# Patient Record
Sex: Male | Born: 1945
Health system: Southern US, Community
[De-identification: ages and names within clinical notes are randomized; demographics above are authoritative.]

## PROBLEM LIST (undated history)

## (undated) DIAGNOSIS — E162 Hypoglycemia, unspecified: Secondary | ICD-10-CM

## (undated) DIAGNOSIS — K219 Gastro-esophageal reflux disease without esophagitis: Secondary | ICD-10-CM

## (undated) DIAGNOSIS — M1990 Unspecified osteoarthritis, unspecified site: Secondary | ICD-10-CM

## (undated) DIAGNOSIS — K759 Inflammatory liver disease, unspecified: Secondary | ICD-10-CM

## (undated) DIAGNOSIS — N529 Male erectile dysfunction, unspecified: Secondary | ICD-10-CM

## (undated) DIAGNOSIS — C61 Malignant neoplasm of prostate: Secondary | ICD-10-CM

## (undated) HISTORY — DX: Hypoglycemia, unspecified: E16.2

## (undated) HISTORY — DX: Male erectile dysfunction, unspecified: N52.9

## (undated) HISTORY — DX: Inflammatory liver disease, unspecified: K75.9

## (undated) HISTORY — DX: Gastro-esophageal reflux disease without esophagitis: K21.9

## (undated) HISTORY — DX: Unspecified osteoarthritis, unspecified site: M19.90

## (undated) HISTORY — PX: ELBOW SURGERY: SHX618

## (undated) HISTORY — PX: FOOT SURGERY: SHX648

## (undated) HISTORY — PX: PROSTATE SURGERY: SHX751

## (undated) HISTORY — DX: Malignant neoplasm of prostate: C61

---

## 2002-01-28 HISTORY — PX: INSERTION PROSTATE RADIATION SEED: SUR718

## 2002-08-24 ENCOUNTER — Ambulatory Visit: Admission: RE | Admit: 2002-08-24 | Discharge: 2002-11-22 | Payer: Self-pay | Admitting: Radiation Oncology

## 2002-09-29 ENCOUNTER — Encounter: Payer: Self-pay | Admitting: Urology

## 2002-09-29 ENCOUNTER — Encounter: Admission: RE | Admit: 2002-09-29 | Discharge: 2002-09-29 | Payer: Self-pay | Admitting: Urology

## 2002-10-27 ENCOUNTER — Ambulatory Visit (HOSPITAL_BASED_OUTPATIENT_CLINIC_OR_DEPARTMENT_OTHER): Admission: RE | Admit: 2002-10-27 | Discharge: 2002-10-27 | Payer: Self-pay | Admitting: Urology

## 2002-10-27 ENCOUNTER — Encounter: Payer: Self-pay | Admitting: Urology

## 2002-10-27 ENCOUNTER — Ambulatory Visit (HOSPITAL_COMMUNITY): Admission: RE | Admit: 2002-10-27 | Discharge: 2002-10-27 | Payer: Self-pay | Admitting: Urology

## 2002-11-28 ENCOUNTER — Ambulatory Visit: Admission: RE | Admit: 2002-11-28 | Discharge: 2002-12-01 | Payer: Self-pay | Admitting: Radiation Oncology

## 2002-12-30 ENCOUNTER — Ambulatory Visit: Admission: RE | Admit: 2002-12-30 | Discharge: 2002-12-30 | Payer: Self-pay | Admitting: Radiation Oncology

## 2005-03-20 ENCOUNTER — Encounter (HOSPITAL_COMMUNITY): Admission: RE | Admit: 2005-03-20 | Discharge: 2005-05-07 | Payer: Self-pay | Admitting: Urology

## 2005-07-11 ENCOUNTER — Ambulatory Visit (HOSPITAL_BASED_OUTPATIENT_CLINIC_OR_DEPARTMENT_OTHER): Admission: RE | Admit: 2005-07-11 | Discharge: 2005-07-11 | Payer: Self-pay | Admitting: Orthopedic Surgery

## 2005-07-11 ENCOUNTER — Encounter (INDEPENDENT_AMBULATORY_CARE_PROVIDER_SITE_OTHER): Payer: Self-pay | Admitting: Specialist

## 2007-06-19 IMAGING — NM NM TUMOR LOCAL/TRACER DISTR WHOLE BODY 2+ DAYS
1 series · 6 of 6 positions shown · non-contrast
Comparison: none

CLINICAL DATA: Prostate Cancer
NM PROSTATE TUMOR LOCALIZATION (SINGLE DAY PROSTASCINT IMAGING) WITH SPECT:
TECHNIQUE: 5.1 mCi of Undium-UUU labeled ProstaScint was administered intravenously. Routine bowel preparation was performed prior to imaging. On day 5, whole-body static imaging and SPECT imaging of the pelvis in the coronal, sagittal and axial planes was performed. The SPECT images were then fused with CT data using [employer] software.  
SPECT images were obtained and infused with CT data obtained at [REDACTED] dated 03/20/05 using the [employer] software package.

[Series 1: pr prostascint · 9.5mm · 9.49mm/px · 6 of 45 frames shown]
[frame 4/45]
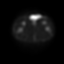
[frame 12/45]
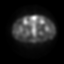
[frame 19/45]
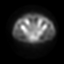
[frame 27/45]
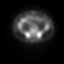
[frame 34/45]
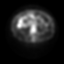
[frame 42/45]
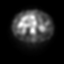

[6 of 6 positions shown; findings below may reference images not displayed]

FINDINGS: Brachytherapy seeds are identified within the prostate gland. There is mild radiotracer uptake within the central portion of the prostate gland.  No abnormal uptake is seen within the seminal vesicles. 
There is no evidence for lymph node metastasis.
IMPRESSION: 1.  No evidence for lymph node metastasis. 
2.  Mild radiotracer uptake within the prostate bed which may represent residual or recurrent local tumor.

## 2008-01-29 HISTORY — PX: HAND SURGERY: SHX662

## 2008-03-14 ENCOUNTER — Encounter: Admission: RE | Admit: 2008-03-14 | Discharge: 2008-03-14 | Payer: Self-pay | Admitting: Family Medicine

## 2008-12-09 ENCOUNTER — Encounter (INDEPENDENT_AMBULATORY_CARE_PROVIDER_SITE_OTHER): Payer: Self-pay | Admitting: Orthopedic Surgery

## 2008-12-09 ENCOUNTER — Ambulatory Visit (HOSPITAL_BASED_OUTPATIENT_CLINIC_OR_DEPARTMENT_OTHER): Admission: RE | Admit: 2008-12-09 | Discharge: 2008-12-09 | Payer: Self-pay | Admitting: Orthopedic Surgery

## 2009-01-04 ENCOUNTER — Ambulatory Visit (HOSPITAL_BASED_OUTPATIENT_CLINIC_OR_DEPARTMENT_OTHER): Admission: RE | Admit: 2009-01-04 | Discharge: 2009-01-04 | Payer: Self-pay | Admitting: Orthopedic Surgery

## 2009-01-28 HISTORY — PX: ACHILLES TENDON REPAIR: SUR1153

## 2010-05-01 LAB — ANAEROBIC CULTURE

## 2010-05-01 LAB — CULTURE, ROUTINE-ABSCESS

## 2010-06-15 NOTE — Op Note (Signed)
NAME:  David Mata, David Mata NO.:  [id number]   MEDICAL RECORD NO.:  [id number]                   PATIENT TYPE:  AMB   LOCATION:  [facility]                                 FACILITY:  [facility]   PHYSICIAN:  Valetta Fuller, M.D.               DATE OF BIRTH:  [birth year]   DATE OF PROCEDURE:  10/27/2002  DATE OF DISCHARGE:                                 OPERATIVE REPORT   PREOPERATIVE DIAGNOSIS:  Clinical stage T1C adenocarcinoma of the prostate.   POSTOPERATIVE DIAGNOSIS:  Clinical stage T1C adenocarcinoma of the prostate.   PROCEDURES PERFORMED:  1. Interstitial prostatic seed implantation with I-125 seeds.  2. Flexible cystoscopy.   SURGEON:  Valetta Fuller, M.D.   ASSISTANT:  Maryln Gottron, M.D.   ANESTHESIA:  General.   INDICATIONS:  David Mata is a 65 year old male.  Approximately a year ago  he was noted to have a mildly elevated PSA of 4.2 with a markedly reduced  PSA-2 reading of 10%.  We recommended ultrasound and biopsy at that time,  but the patient elected not to pursue that.  He subsequently was seen in  follow-up.  His PSA had increased to 5.  He then agreed to ultrasound and  biopsy.  Ultrasound revealed approximately a 30 g prostate.  The patient did  end up with bilateral Gleason's 3+4=7 adenocarcinoma.  The patient underwent  extensive counseling by myself as well as Dr. Dayton Scrape with regard to  treatment options.  We felt that radical retropubic prostatectomy might be  the best approach, but he was unwilling to accept some of the potential  complications and issues with regard to radical prostatectomy.  He elected  to proceed with a combination of external beam radiation therapy and  interstitial seed implantation.  The patient received 4500 cGy of external  beam radiation therapy, which was completed approximately two weeks ago.  He  presents now for interstitial seed implantation.   TECHNIQUE AND FINDINGS:  The patient was brought to  the operating room,  where he had successful induction of general endotracheal anesthesia.  He  was placed in the mid-lithotomy position and prepped and draped in the usual  manner.  Great care was taken in positioning the patient to simulate that of  the previous ultrasound.  A transrectal probe was utilized and fluoroscopy  was also brought in.  Once we felt that the positioning was excellent and  the prostatic positioning was the same as it was on the planning ultrasound,  we anchored the ultrasound stand.  Anchoring needles were placed into the  prostate and volume study was repeated along with contouring.  Seed  implantation was then performed.  This was done with real-time transrectal  ultrasound imaging as well as real-time fluoroscopy.  The rectal interface  had been contoured.  A total of 22 needles were placed with 70 seeds.  Again  each needle passage was done with real-time fluoroscopy as well as  transrectal ultrasonography.  At the completion of the procedure the real-  time dosimetry report looked excellent.  The Foley catheter which had been  placed initially with  contrast in the balloon was removed and flexible cystoscopy was performed.  We saw no evidence of seeds within the urethra or bladder.  A repeat Foley  catheter was inserted and left to gravity drainage.  The patient appeared to  tolerate the procedure well.  There were no obvious complications.                                               Valetta Fuller, M.D.    DSG/MEDQ  D:  10/27/2002  T:  10/27/2002  Job:  [id number]   cc:   David Mata. Manus Gunning, M.D.  [address] [place]  [place] [address]  Fax: [phone]   Maryln Gottron, M.D.  [address] [facility]  [place]  [place]  [address]  Fax: [phone]

## 2010-06-15 NOTE — Op Note (Signed)
NAME:  David Mata, HANTON NO.:  [id number]   MEDICAL RECORD NO.:  [id number]          PATIENT TYPE:  AMB   LOCATION:  [facility]                          FACILITY:  [facility]   PHYSICIAN:  Cindee Salt, M.D.       DATE OF BIRTH:  1945-12-25   DATE OF PROCEDURE:  DATE OF DISCHARGE:                                 OPERATIVE REPORT   PREOPERATIVE DIAGNOSIS:  Right carpal tunnel syndrome, olecranon bursa,  right elbow.   POSTOPERATIVE DIAGNOSIS:  Right carpal tunnel syndrome, olecranon bursa,  right elbow.   OPERATION:  Excision olecranon bursa with gouty tophus, right carpal tunnel  release.   SURGEON:  Cindee Salt, M.D.   ASSISTANT:  Carolyne Fiscal R.N.   ANESTHESIA:  General.   HISTORY:  The patient is a 65-year-old male with a history of a large  olecranon bursa with obvious gouty tophus inclusion.  This is problematic  for him due to its size.  He also has carpal tunnel syndrome, EMG nerve  conductions positive,is desirous of removal of the olecranon bursa and  release of carpal tunnel.  He is aware of risks and complications including  injury to the ulnar nerve, incomplete relief of symptoms, infection,  recurrence of the bursa in the preoperative area.  He is seen, identified  given antibiotics questions encouraged and answered.  The areas of surgery  marked by both the patient and surgeon.   PROCEDURE:  The patient was brought to the operating room where a general  anesthetic was carried out without difficulty.  He was prepped using  DuraPrep, supine position.  After 3 minutes of dry time he was draped.  The  limb was exsanguinated with an Esmarch bandage, tourniquet placed on the arm  was inflated to 250 mmHg.  The carpal tunnel release was performed first.  A  longitudinal incision was made in the palm, carried down through  subcutaneous tissue.  Bleeders were electrocauterized.  The palmar fascia  was split, the superficial palmar arch identified.  The flexor tendon to  the  ring and little finger identified.  To the ulnar side of median nerve.  The  carpal retinaculum was incised with sharp dissection, a right angle and  Sewall retractor were placed between skin and forearm fascia.  The fascia  was released for approximately a centimeter and a half proximal to the wrist  crease under direct vision.  The canal was explored.  No tophaceous material  was noted.  Area of compression to the nerve was apparent.  No further  lesions were identified.  The wound was irrigated and skin was closed with  interrupted 5-0 nylon sutures.  A separate longitudinal incision was made  posteriorly on the elbow, carried down through subcutaneous tissue.  A large  tophaceous gouty material was immediately encountered.  With blunt sharp  dissection, this was dissected free.  This measured approximately 10 cm in  diameter.  With blunt sharp dissection the entire olecranon bursa was  removed with care to protect the ulnar nerve and with no violation of  Osborne's fascia.  The entire  specimen was sent to pathology.  The area of  olecranon proximally was debrided with a rongeur, taking care to maintain  the insertion of the triceps.  The wound was then irrigated.  A doubled over  vessel loop placed.  The subcutaneous tissue closed with interrupted 4-0  Vicryl and skin with interrupted 5-0 nylon sutures.  Sterile  compressive dressing and long-arm splint applied.  The patient tolerated the  procedure well and was taken to the recovery room for observation in  satisfactory condition.  He will be discharged home to return to the [facility] [place] in one week on Vicodin.           ______________________________  Cindee Salt, M.D.     GK/MEDQ  D:  07/11/2005  T:  07/11/2005  Job:  [id number]

## 2010-11-07 ENCOUNTER — Other Ambulatory Visit: Payer: Self-pay | Admitting: Family Medicine

## 2010-11-07 DIAGNOSIS — R7989 Other specified abnormal findings of blood chemistry: Secondary | ICD-10-CM

## 2010-11-08 ENCOUNTER — Ambulatory Visit
Admission: RE | Admit: 2010-11-08 | Discharge: 2010-11-08 | Disposition: A | Discharge status: Home / Self Care | Payer: Medicare Other | Source: Ambulatory Visit | Attending: Family Medicine | Admitting: Family Medicine

## 2010-11-08 DIAGNOSIS — R7989 Other specified abnormal findings of blood chemistry: Secondary | ICD-10-CM

## 2010-11-09 ENCOUNTER — Other Ambulatory Visit: Payer: Self-pay | Admitting: Family Medicine

## 2010-11-09 DIAGNOSIS — R935 Abnormal findings on diagnostic imaging of other abdominal regions, including retroperitoneum: Secondary | ICD-10-CM

## 2010-11-12 ENCOUNTER — Ambulatory Visit
Admission: RE | Admit: 2010-11-12 | Discharge: 2010-11-12 | Disposition: A | Discharge status: Home / Self Care | Payer: Medicare Other | Source: Ambulatory Visit | Attending: Family Medicine | Admitting: Family Medicine

## 2010-11-12 DIAGNOSIS — R935 Abnormal findings on diagnostic imaging of other abdominal regions, including retroperitoneum: Secondary | ICD-10-CM

## 2010-11-12 MED ORDER — GADOBENATE DIMEGLUMINE 529 MG/ML IV SOLN
20.0000 mL | Freq: Once | INTRAVENOUS | Status: AC | PRN
  Administered 2010-11-12: 20 mL via INTRAVENOUS

## 2010-11-16 ENCOUNTER — Encounter (INDEPENDENT_AMBULATORY_CARE_PROVIDER_SITE_OTHER): Payer: Self-pay | Admitting: General Surgery

## 2010-11-22 ENCOUNTER — Other Ambulatory Visit (INDEPENDENT_AMBULATORY_CARE_PROVIDER_SITE_OTHER): Payer: Self-pay | Admitting: General Surgery

## 2010-11-22 ENCOUNTER — Encounter (INDEPENDENT_AMBULATORY_CARE_PROVIDER_SITE_OTHER): Payer: Self-pay | Admitting: General Surgery

## 2010-11-22 ENCOUNTER — Ambulatory Visit (INDEPENDENT_AMBULATORY_CARE_PROVIDER_SITE_OTHER): Payer: Medicare Other | Admitting: General Surgery

## 2010-11-22 VITALS — BP 122/70 | HR 64 | Temp 96.3°F | Resp 20 | Ht 73.0 in | Wt 244.5 lb

## 2010-11-22 DIAGNOSIS — K8020 Calculus of gallbladder without cholecystitis without obstruction: Secondary | ICD-10-CM

## 2010-11-22 NOTE — Patient Instructions (Signed)
Laparoscopic Cholecystectomy Laparoscopic cholecystectomy is surgery to remove the gallbladder. The gallbladder is located slightly to the right of center in the abdomen, behind the liver. It is a concentrating and storage sac for the bile produced in the liver. Bile aids in the digestion and absorption of fats. Gallbladder disease (cholecystitis) is an inflammation of your gallbladder. This condition is usually caused by a buildup of gallstones (cholelithiasis) in your gallbladder. Gallstones can block the flow of bile, resulting in inflammation and pain. In severe cases, emergency surgery may be required. When emergency surgery is not required, you will have time to prepare for the procedure. Laparoscopic surgery is an alternative to open surgery. Laparoscopic surgery usually has a shorter recovery time. Your common bile duct may also need to be examined and explored. Your caregiver will discuss this with you if he or she feels this should be done. If stones are found in the common bile duct, they may be removed. LET YOUR CAREGIVER KNOW ABOUT:  Allergies to food or medicine.   Medicines taken, including vitamins, herbs, eyedrops, over-the-counter medicines, and creams.   Use of steroids (by mouth or creams).   Previous problems with anesthetics or numbing medicines.   History of bleeding problems or blood clots.   Previous surgery.   Other health problems, including diabetes and kidney problems.   Possibility of pregnancy, if this applies.  RISKS AND COMPLICATIONS All surgery is associated with risks. Some problems that may occur following this procedure include:  Infection.   Damage to the common bile duct, nerves, arteries, veins, or other internal organs such as the stomach or intestines.   Bleeding.   A stone may remain in the common bile duct.  BEFORE THE PROCEDURE  Do not take aspirin for 3 days prior to surgery or blood thinners for 1 week prior to surgery.   Do not eat or  drink anything after midnight the night before surgery.   Let your caregiver know if you develop a cold or other infectious problem prior to surgery.   You should be present 60 minutes before the procedure or as directed.  PROCEDURE  You will be given medicine that makes you sleep (general anesthetic). When you are asleep, your surgeon will make several small cuts (incisions) in your abdomen. One of these incisions is used to insert a small, lighted scope (laparoscope) into the abdomen. The laparoscope helps the surgeon see into your abdomen. Carbon dioxide gas will be pumped into your abdomen. The gas allows more room for the surgeon to perform your surgery. Other operating instruments are inserted through the other incisions. Laparoscopic procedures may not be appropriate when:  There is major scarring from previous surgery.   The gallbladder is extremely inflamed.   There are bleeding disorders or unexpected cirrhosis of the liver.   A pregnancy is near term.   Other conditions make the laparoscopic procedure impossible.  If your surgeon feels it is not safe to continue with a laparoscopic procedure, he or she will perform an open abdominal procedure. In this case, the surgeon will make an incision to open the abdomen. This gives the surgeon a larger view and field to work within. This may allow the surgeon to perform procedures that sometimes cannot be performed with a laparoscope alone. Open surgery has a longer recovery time. AFTER THE PROCEDURE  You will be taken to the recovery area where a nurse will watch and check your progress.   You may be allowed to go home   the same day.   Do not resume physical activities until directed by your caregiver.   You may resume a normal diet and activities as directed.  Document Released: 01/14/2005 Document Revised: 09/26/2010 Document Reviewed: 06/29/2010 [facility] Patient Information 2012 [place], [place].  Laparoscopic Cholecystectomy Care  After These instructions give you information on caring for yourself after your procedure. Your doctor may also give you more specific instructions. Call your doctor if you have any problems or questions after your procedure. HOME CARE  Change your bandages (dressings) as told by your doctor.   Keep the wound dry and clean. Wash the wound gently with soap and water. Pat the wound dry with a clean towel.   Do not take baths, swim, or use hot tubs for 10 days, or as told by your doctor.   Only take medicine as told by your doctor.   Eat a normal diet as told by your doctor.   Do not lift anything heavier than 25 pounds (11.5 kg), or as told by your doctor.   Do not play contact sports for 1 week, or as told by your doctor.  GET HELP RIGHT AWAY IF:   Your wound is red, puffy (swollen), or painful.   You have yellowish-white fluid (pus) coming from the wound.   You have fluid draining from the wound for more than 1 day.   You have a bad smell coming from the wound.   Your wound breaks open.   You have a rash.   You have trouble breathing.   You have chest pain.   You have a bad reaction to your medicine.   You have a fever.   You have pain in the shoulders (shoulder strap areas).   You feel dizzy or pass out (faint).   You have severe belly (abdominal) pain.   You feel sick to your stomach (nauseous) or throw up (vomit) for more than 1 day.  MAKE SURE YOU:  Understand these instructions.   Will watch your condition.   Will get help right away if you are not doing well or get worse.  Document Released: 10/24/2007 Document Revised: 09/26/2010 Document Reviewed: 07/03/2010 [facility] Patient Information 2012 [place], [place].

## 2010-12-07 ENCOUNTER — Ambulatory Visit: Admit: 2010-12-07 | Payer: Self-pay | Admitting: General Surgery

## 2010-12-07 HISTORY — PX: CHOLECYSTECTOMY: SHX55

## 2010-12-07 SURGERY — LAPAROSCOPIC CHOLECYSTECTOMY WITH INTRAOPERATIVE CHOLANGIOGRAM
Anesthesia: General

## 2010-12-12 ENCOUNTER — Telehealth (INDEPENDENT_AMBULATORY_CARE_PROVIDER_SITE_OTHER): Payer: Self-pay | Admitting: General Surgery

## 2010-12-12 ENCOUNTER — Encounter (INDEPENDENT_AMBULATORY_CARE_PROVIDER_SITE_OTHER): Payer: Self-pay | Admitting: General Surgery

## 2010-12-12 NOTE — Telephone Encounter (Signed)
Per Dr Andrey Campanile, patient definitely needs CMET drawn prior to surgery. Spoke with Luanne who said she would get that done.

## 2010-12-12 NOTE — Progress Notes (Signed)
Chief Complaint  Patient presents with  . New Evaluation    eval of GB     HPI David Mata is a 65 y.o. male.   HPI  65 year old male referred by his primary care physician for evaluation for abdominal pain. The patient states that 5 weeks ago while riding in his car to his dentist appointment he developed severe upper abdominal pain. It is associated with nausea and diarrhea. He also had some dark urine that evening. He also endorsed some subjective fevers and chills. The pain resolved over the next couple hours. He denies any jaundice, weight loss, reflux, difficulty swallowing liquids or foods.  He states that his stool was slightly white in color for several days however it has normalized. He states that his appetite still hasn't returned to normal. He has had no additional attacks. He saw his primary care physician who ordered lab work and an abdominal ultrasound.  Past Medical History  Diagnosis Date  . Gout   . Prostate cancer   . Erectile dysfunction   . Hypoglycemia   . Gout   . Arthritis     Past Surgical History  Procedure Date  . Insertion prostate radiation seed 2004  . Achilles tendon repair 2011    right heel   . Elbow surgery 2008, 2010    Gout  both elbows  . Hand surgery 2010    gout     History reviewed. No pertinent family history.  Social History History  Substance Use Topics  . Smoking status: Former Smoker    Types: Cigars  . Smokeless tobacco: Never Used  . Alcohol Use: Yes    No Known Allergies  Current Outpatient Prescriptions  Medication Sig Dispense Refill  . Febuxostat (ULORIC) 80 MG TABS Take 80 mg by mouth daily.          Review of Systems Review of Systems  Constitutional: Positive for fever (subjective). Negative for activity change and unexpected weight change.  HENT: Negative for congestion, rhinorrhea and trouble swallowing.        Recent dental surgery - on amoxicillin  Eyes: Negative for photophobia and visual  disturbance.  Respiratory: Negative for chest tightness, shortness of breath and wheezing.   Cardiovascular: Negative for chest pain and leg swelling.       No SOB, No DOE, no orthopnea  Gastrointestinal: Negative for blood in stool, anal bleeding and rectal pain.       See hpi  Genitourinary: Negative for dysuria, difficulty urinating and penile pain.       +nocturia  Musculoskeletal:       Occasional gout problems  Neurological: Positive for headaches (ocassional). Negative for tremors, seizures, syncope and speech difficulty.  Hematological: Negative.   Psychiatric/Behavioral: Negative.     Blood pressure 122/70, pulse 64, temperature 96.3 F (35.7 C), temperature source Temporal, resp. rate 20, height 6\' 1"  (1.854 m), weight 244 lb 8 oz (110.904 kg).  Physical Exam Physical Exam  Vitals reviewed. Constitutional: He is oriented to person, place, and time. He appears well-developed and well-nourished.  HENT:  Head: Normocephalic and atraumatic.       No obvious dental abscess  Eyes: Conjunctivae are normal. No scleral icterus.  Neck: Normal range of motion. Neck supple. No tracheal deviation present.  Cardiovascular: Normal rate, regular rhythm and normal heart sounds.   Pulmonary/Chest: Effort normal and breath sounds normal.  Abdominal: Soft. Bowel sounds are normal. He exhibits no distension. There is no tenderness. There is no  rebound.  Musculoskeletal: Normal range of motion. He exhibits no edema.  Lymphadenopathy:    He has no cervical adenopathy.  Neurological: He is alert and oriented to person, place, and time. He exhibits normal muscle tone.  Skin: Skin is warm and dry.  Psychiatric: He has a normal mood and affect. His behavior is normal. Judgment and thought content normal.    Data Reviewed COMPLETE ABDOMINAL ULTRASOUND  Comparison: None.  Findings:  Gallbladder: There is a single echogenic focus within the  gallbladder which appears to move measuring 13 mm  most consistent  with gallstones. No pain is present over gallbladder with  compression.  Common bile duct: The common bile duct is normal measuring 4.5 mm  in diameter.  Liver: The liver is diffusely echogenic consistent with fatty  infiltration. No focal abnormality is seen.  IVC: Appears normal.  Pancreas: The head and tail of the pancreas are partially obscured  by bowel gas.  Spleen: The spleen is enlarged measuring 17.4 cm sagittally with a  volume of 1156 ml.  Right Kidney: No hydronephrosis is seen. The right kidney  measures 11.3 cm sagittally. However there is a focal bulge within  the mid lower pole which appears somewhat hypervascular and a renal  carcinoma cannot be excluded. The echogenicity however of this  focal lesion is similar to adjacent renal parenchyma and this could  represent normal lobulation of the right kidney. MRI of the abdomen  is recommended to evaluate further.  Left Kidney: No hydronephrosis is seen. The left kidney measures  12.7 cm. There is a similar slightly hypoechogenic bulging lesion  in the mid lower left kidney, and again a renal cell carcinoma  cannot be excluded. This lesion measures 2.9 x 2.8 x 2.7 cm.  Further assessment with MRI is recommended.  Abdominal aorta: The abdominal aorta is normal in caliber.  IMPRESSION:  1. Two bulging lesions in the mid lower kidneys with the left more  suspicious than the right for possible renal cell carcinoma.  Recommend MRI to assess further.  2. Single 13 mm gallstone. No pain over the gallbladder.  3. Fatty infiltration of the liver.  4. Portions of the pancreas are obscured by bowel gas.  5. Splenomegaly.  MRI ABDOMEN WITH AND WITHOUT CONTRAST  Technique: Multiplanar multisequence MR imaging of the abdomen was  performed both before and after administration of intravenous  contrast.  Contrast: 20mL MULTIHANCE GADOBENATE DIMEGLUMINE 529 MG/ML IV SOLN  Comparison: Ultrasound dated 11/08/2010    Findings: Kidneys are within normal limits. No renal cysts. No  enhancing renal lesions. Sonographic findings may have reflected  cortical lobulation.  No definite hepatic steatosis. No suspicious/enhancing hepatic  lesions.  Splenomegaly, measuring 16.9 cm in maximal dimension.  Pancreas and adrenal glands are within normal limits.  Cholelithiasis, without associated inflammatory changes. No  intrahepatic or extrahepatic ductal dilatation.  The visualized bowel is unremarkable.  No abdominal ascites.  No suspicious abdominal lymphadenopathy.  No focal osseous lesions.  IMPRESSION:  No suspicious/enhancing renal lesions. Sonographic findings may  have reflected cortical lobulation.  Cholelithiasis, without associated inflammatory changes.  Splenomegaly.  CMET from 11/07/10 t bili 1.3 ast 62 alt63 AP 70 nml cbc, nml tsh   Assessment    Symptomatic cholelihiasis    Plan    We discussed gallbladder disease. The patient was given Agricultural engineer. We discussed non-operative and operative management.   I discussed laparoscopic cholecystectomy with cholangiogram in detail.  The patient was given educational material as well  as diagrams detailing the procedure.  We discussed the risks and benefits of a laparoscopic cholecystectomy including, but not limited to bleeding, infection, injury to surrounding structures such as the intestine or liver, bile leak, retained gallstones, need to convert to an open procedure, prolonged diarrhea, blood clots such as  DVT, common bile duct injury, anesthesia risks, and possible need for additional procedures.  We discussed the typical post-operative recovery course. I explained that the likelihood of improvement of their symptoms is good.  The pt has elected to proceed with surgery. We will repeat his labs prior to surgery.  Mary Sella. Andrey Campanile, MD, FACS General, Bariatric, & Minimally Invasive Surgery [facility], [place]          [facility] M 12/12/2010, 8:50 AM

## 2010-12-13 ENCOUNTER — Other Ambulatory Visit (INDEPENDENT_AMBULATORY_CARE_PROVIDER_SITE_OTHER): Payer: Self-pay | Admitting: General Surgery

## 2010-12-13 DIAGNOSIS — K8010 Calculus of gallbladder with chronic cholecystitis without obstruction: Secondary | ICD-10-CM

## 2010-12-17 ENCOUNTER — Telehealth (INDEPENDENT_AMBULATORY_CARE_PROVIDER_SITE_OTHER): Payer: Self-pay

## 2010-12-17 NOTE — Telephone Encounter (Signed)
Patient called needing a 2 week follow up. Dr Andrey Campanile schedule was full except Dec 6th and only had new patient spots on it. Please call patient with an appointment time.

## 2010-12-18 NOTE — Telephone Encounter (Signed)
Appt made. Patient made aware to call if has any problems prior. Patient states he had a rough time the night of surgery at home but is better. Had some constipation, but is better today.

## 2011-01-03 ENCOUNTER — Encounter (INDEPENDENT_AMBULATORY_CARE_PROVIDER_SITE_OTHER): Payer: Self-pay | Admitting: General Surgery

## 2011-01-03 ENCOUNTER — Ambulatory Visit (INDEPENDENT_AMBULATORY_CARE_PROVIDER_SITE_OTHER): Payer: Medicare Other | Admitting: General Surgery

## 2011-01-03 VITALS — BP 126/82 | HR 60 | Temp 97.2°F | Resp 18 | Ht 72.0 in | Wt 242.4 lb

## 2011-01-03 DIAGNOSIS — Z09 Encounter for follow-up examination after completed treatment for conditions other than malignant neoplasm: Secondary | ICD-10-CM

## 2011-01-03 NOTE — Progress Notes (Signed)
Chief complaint: Postop  Procedure: Status post laparoscopic cholecystectomy with interoperative cholangiogram November 9  History of Present Ilness: 65 year old Caucasian male comes in for followup. He states that he is now doing well. He had about 4 days of constipation. He is now having daily bowel movements. He denies any diarrhea. He denies any fevers, chills, nausea, vomiting. He reports a good appetite. He did have a lot of gas pain in the evening after surgery, but that has resolved. He has no questions or concerns today  Physical Exam: BP 126/82  Pulse 60  Temp(Src) 97.2 F (36.2 C) (Temporal)  Resp 18  Ht 6' (1.829 m)  Wt 242 lb 6 oz (109.941 kg)  BMI 32.87 kg/m2  Gen: alert, NAD, non-toxic appearing Pupils: equal, no scleral icterus Pulm: Lungs clear to auscultation, symmetric chest rise CV: regular rate and rhythm Abd: soft, nontender, nondistended. Well-healed trocar sites. No cellulitis. No incisional hernia Ext: no edema, no calf tenderness Skin: no rash, no jaundice  Pathology: Chronic cholecystitis and cholelithiasis  Assessment and Plan: We discussed his pathology report. He appears to be doing well. We discussed the findings during surgery.  Since he is doing so well I am going to see him on an as needed basis. He was released to full activities  Mary Sella. Andrey Campanile, MD, FACS General, Bariatric, & Minimally Invasive Surgery [facility], [place]

## 2011-01-03 NOTE — Patient Instructions (Signed)
Your pathology showed chronic cholecystitis and gallstones (routine common finding)

## 2011-01-09 ENCOUNTER — Encounter (INDEPENDENT_AMBULATORY_CARE_PROVIDER_SITE_OTHER): Payer: Self-pay | Admitting: General Surgery

## 2011-01-09 ENCOUNTER — Encounter (INDEPENDENT_AMBULATORY_CARE_PROVIDER_SITE_OTHER): Payer: Self-pay

## 2011-11-07 ENCOUNTER — Other Ambulatory Visit: Payer: Self-pay | Admitting: Orthopaedic Surgery

## 2011-11-07 DIAGNOSIS — M542 Cervicalgia: Secondary | ICD-10-CM

## 2011-11-12 ENCOUNTER — Ambulatory Visit
Admission: RE | Admit: 2011-11-12 | Discharge: 2011-11-12 | Disposition: A | Discharge status: Home / Self Care | Payer: Medicare Other | Source: Ambulatory Visit | Attending: Orthopaedic Surgery | Admitting: Orthopaedic Surgery

## 2011-11-12 DIAGNOSIS — M542 Cervicalgia: Secondary | ICD-10-CM

## 2013-07-19 ENCOUNTER — Encounter: Payer: Self-pay | Admitting: Podiatry

## 2013-07-19 ENCOUNTER — Ambulatory Visit (INDEPENDENT_AMBULATORY_CARE_PROVIDER_SITE_OTHER): Payer: Medicare Other | Admitting: Podiatry

## 2013-07-19 VITALS — BP 141/76 | HR 67 | Resp 12

## 2013-07-19 DIAGNOSIS — L03039 Cellulitis of unspecified toe: Secondary | ICD-10-CM

## 2013-07-19 MED ORDER — AMOXICILLIN-POT CLAVULANATE 875-125 MG PO TABS: 1.0000 | ORAL_TABLET | Freq: Two times a day (BID) | ORAL | Status: DC

## 2013-07-19 NOTE — Patient Instructions (Signed)

## 2013-07-19 NOTE — Progress Notes (Signed)
   Subjective:    Patient ID: David Mata, male    DOB: 1945-09-16, 68 y.o.   MRN: [id number]  HPI  PT STATED LT FOOT GREAT TOE IS SWOLLEN, REDNESS, AND PAINFUL FOR 2 WEEKS. THE TOE IS BEEN THE SAME. THE TOE GET AGGRAVATED BY WEARING SHOES AND PRESSURE. TREATMENT TRIED SOAKING WITH EPSON SALT AND NEOSPORIN AND NO HELP.  Review of Systems  All other systems reviewed and are negative.      Objective:   Physical Exam Orientated x3 white male  Vascular: DP and PT pulses 2/4 bilaterally  Neurological: Ankle reflexes equal and reactive bilaterally  Dermatological: The right hallux distally demonstrates erythema, edema and warmth extending to the proximal interphalangeal joint. The lateral margin right hallux toenail is very tender to pressure with slight serous drainage noted.  Musculoskeletal: No restriction ankle, subtalar, midtarsal joints bilaterally       Assessment & Plan:   Assessment: Paronychia right hallux  Plan: The right hallux is blocked with 4 cc of 50-50 mixture of 2% plain Xylocaine and 0.5% plain Marcaine. The right hallux is prepped with Betadine and exsanguinated. The lateral margin of the right hallux toenail was excised. A small amount of serous-like drainage was noted. An antibiotic compression dressing was applied. The tourniquet was released and spontaneous capillary filling time noted in the right hallux  Postoperative oral and written instructions provided Augmentin 875/125  by mouth twice a day x10 days  Reappoint if symptoms persist or worsen after completion of antibiotics

## 2013-07-20 ENCOUNTER — Encounter: Payer: Self-pay | Admitting: Podiatry

## 2013-09-13 ENCOUNTER — Encounter: Payer: Self-pay | Admitting: Internal Medicine

## 2013-09-13 ENCOUNTER — Ambulatory Visit (INDEPENDENT_AMBULATORY_CARE_PROVIDER_SITE_OTHER): Payer: Medicare Other | Admitting: Internal Medicine

## 2013-09-13 VITALS — BP 142/76 | HR 68 | Resp 18 | Wt 249.0 lb

## 2013-09-13 DIAGNOSIS — R079 Chest pain, unspecified: Secondary | ICD-10-CM

## 2013-09-13 DIAGNOSIS — R0789 Other chest pain: Secondary | ICD-10-CM

## 2013-09-13 NOTE — Progress Notes (Signed)
HPI Patient is a 68 yo who is referred for evaluation of CP  No history of CAD Occasional chest heaviness.  NOt associated with activty  Yesterday woke up with some  Eased on own.   Works in [employer]   More tired than usual    Note labs in 2014 LDL was 86 No Known Allergies  Current Outpatient Prescriptions  Medication Sig Dispense Refill  . Febuxostat (ULORIC) 80 MG TABS Take 80 mg by mouth daily.         No current facility-administered medications for this visit.    Past Medical History  Diagnosis Date  . Gout   . Prostate cancer   . Erectile dysfunction   . Hypoglycemia   . Gout   . Arthritis     Past Surgical History  Procedure Laterality Date  . Insertion prostate radiation seed  2004  . Achilles tendon repair  2011    right heel   . Elbow surgery  2008, 2010    Gout  both elbows  . Hand surgery  2010    gout   . Cholecystectomy  12/07/10    with IOC  . Prostate surgery      Family History  Problem Relation Age of Onset  . Heart failure Mother   . Heart failure Father   . Heart attack Father   . Cancer Paternal Grandfather   . Heart attack Paternal Grandfather   . Diabetes Maternal Grandfather     History   Social History  . Marital Status: Single    Spouse Name: N/A    Number of Children: N/A  . Years of Education: N/A   Occupational History  . Not on file.   Social History Main Topics  . Smoking status: Former Smoker    Types: Cigars  . Smokeless tobacco: Never Used  . Alcohol Use: Yes  . Drug Use: No  . Sexual Activity:    Other Topics Concern  . Not on file   Social History Narrative  . No narrative on file    Review of Systems:  All systems reviewed.  They are negative to the above problem except as previously stated.  Vital Signs: BP 142/76  Pulse 68  Resp 18  Wt 249 lb (112.946 kg)  Physical Exam Patient is in NAD HEENT:  Normocephalic, atraumatic. EOMI, PERRLA.  Neck: JVP is normal.  No bruits.   Lungs: clear to auscultation. No rales no wheezes.  Heart: Regular rate and rhythm. Normal S1, S2. No S3.   No significant murmurs. PMI not displaced.  Abdomen:  Supple, nontender. Normal bowel sounds. No masses. No hepatomegaly.  Extremities:   Good distal pulses throughout. No lower extremity edema.  Musculoskeletal :moving all extremities.  Neuro:   alert and oriented x3.  CN II-XII grossly intact.  EKG  (08/16/13)  SR 74 bpm   Assessment and Plan:  1.  CP  Pain is somewhat atypical  Patient says he is not having any problems with actvities  Does not think he has any heart problems    I would not plan any testing for now.  Instructed the patient to watch for change in ability to do things, in SOB  In other new symptoms   Will be available as needed.

## 2013-09-13 NOTE — Patient Instructions (Signed)
Your physician recommends that you continue on your current medications as directed. Please refer to the Current Medication list given to you today.  Your physician recommends that you schedule a follow-up appointment AS NEEDED WITH DR.ROSS.   

## 2014-02-16 ENCOUNTER — Ambulatory Visit (INDEPENDENT_AMBULATORY_CARE_PROVIDER_SITE_OTHER): Payer: Medicare Other | Admitting: Podiatry

## 2014-02-16 ENCOUNTER — Encounter: Payer: Self-pay | Admitting: Podiatry

## 2014-02-16 VITALS — BP 144/77 | HR 70 | Resp 13 | Ht 72.0 in | Wt 240.0 lb

## 2014-02-16 DIAGNOSIS — L600 Ingrowing nail: Secondary | ICD-10-CM

## 2014-02-16 NOTE — Patient Instructions (Signed)
If pain control as needed use over-the-counter Tylenol ANTIBACTERIAL SOAP INSTRUCTIONS  THE DAY AFTER PROCEDURE  Please follow the instructions your doctor has marked.   Shower as usual. Before getting out, place a drop of antibacterial liquid soap (Dial) on a wet, clean washcloth.  Gently wipe washcloth over affected area.  Afterward, rinse the area with warm water.  Blot the area dry with a soft cloth and cover with antibiotic ointment (neosporin, polysporin, bacitracin) and band aid or gauze and tape  Place 3-4 drops of antibacterial liquid soap in a quart of warm tap water.  Submerge foot into water for 20 minutes.  If bandage was applied after your procedure, leave on to allow for easy lift off, then remove and continue with soak for the remaining time.  Next, blot area dry with a soft cloth and cover with a bandage.  Apply other medications as directed by your doctor, such as cortisporin otic solution (eardrops) or neosporin antibiotic ointment

## 2014-02-16 NOTE — Progress Notes (Signed)
   Subjective:    Patient ID: David Mata, male    DOB: 12/05/1944, 69 y.o.   MRN: [id number]  HPI Comments: N ingrown toenail L left lateral 1st toenail border D 1 month O worsening C swelling, red, peeling and painful A enclosed shoe and pressure T H202 cleansings  This patient presents again complaining of a painful ingrowing toenail on the left hallux. He presented on on 07/19/2013 with a similar problem. This chart note chief complaint is the left hallux, however, the note was treated as the right hallux.   Review of Systems  Musculoskeletal: Positive for myalgias.       Right hamstring problem  All other systems reviewed and are negative.      Objective:   Physical Exam Orientated 3  Vascular: DP pulses 2/4 bilaterally PT pulses 2/4 bilaterally Capillary reflex immediate bilaterally  Neurological: Deferred  Dermatological: The lateral margin of the left hallux toenail is incurvated with low-grade erythema, edema and tenderness. No drainage noted.  Musculoskeletal: Stable gait No restriction ankle, subtalar, midtarsal joints bilaterally      Assessment & Plan:   Assessment: Ingrowing lateral margin of the left hallux toenail with associated paronychia  Plan: Offered patient permanent removal of the lateral margin of the left hallux toenail and he verbally consents. The left hallux was blocked with 4 mL of 50-50 mixture 2% plain Xylocaine and 0.5% plain Marcaine. The left hallux was painted with Betadine and exsanguinated. The lateral margin of left hallux toenail was excised and a phenol matricectomy performed. There was no drainage noted. An antibiotic compression dressing was applied. The tourniquet was released and spontaneous capillary fill time noted in left hallux. Patient tolerated procedure without any difficulty.  Postoperative oral and written instructions provided. Recommended over-the-counter acetaminophen for pain control if needed.  Patient  will contact us if he has the future concerns

## 2014-02-17 ENCOUNTER — Encounter: Payer: Self-pay | Admitting: Podiatry

## 2014-03-28 ENCOUNTER — Ambulatory Visit: Payer: Medicare Other | Admitting: Podiatry

## 2014-04-04 ENCOUNTER — Encounter: Payer: Self-pay | Admitting: Podiatry

## 2014-04-04 ENCOUNTER — Ambulatory Visit (INDEPENDENT_AMBULATORY_CARE_PROVIDER_SITE_OTHER): Payer: Medicare Other | Admitting: Podiatry

## 2014-04-04 VITALS — BP 135/76 | HR 65 | Temp 97.3°F

## 2014-04-04 DIAGNOSIS — L600 Ingrowing nail: Secondary | ICD-10-CM

## 2014-04-04 NOTE — Patient Instructions (Signed)
Use over-the-counter acetaminophen, ibuprofen or Aleve as needed for pain control ANTIBACTERIAL SOAP INSTRUCTIONS  THE DAY AFTER PROCEDURE  Please follow the instructions your doctor has marked.   Shower as usual. Before getting out, place a drop of antibacterial liquid soap (Dial) on a wet, clean washcloth.  Gently wipe washcloth over affected area.  Afterward, rinse the area with warm water.  Blot the area dry with a soft cloth and cover with antibiotic ointment (neosporin, polysporin, bacitracin) and band aid or gauze and tape  Place 3-4 drops of antibacterial liquid soap in a quart of warm tap water.  Submerge foot into water for 20 minutes.  If bandage was applied after your procedure, leave on to allow for easy lift off, then remove and continue with soak for the remaining time.  Next, blot area dry with a soft cloth and cover with a bandage.  Apply other medications as directed by your doctor, such as cortisporin otic solution (eardrops) or neosporin antibiotic ointment

## 2014-04-05 NOTE — Progress Notes (Signed)
Patient ID: ANUSH WIEDEMAN, male   DOB: 10-Aug-1945, 69 y.o.   MRN: [id number]  Subjective: Patient presents today complaining of painful medial margin of the left hallux toenail with a history of some drainage and tenderness in the past 2 weeks. It is difficulty tolerating her close shoe. The lateral margin of the left hallux toenail was excised for permanent correction on the visit of 02/16/2014 and this site is comfortable  Objective: No change in patient's health history from visit of 02/16/2014  Vascular: DP and PT pulses 2/4 bilaterally Capillary reflex immediate bilaterally  Neurological: Deferred  Dermatological: The medial margin of the left hallux toenail is incurvated without any drainage and exquisite palpable tenderness which duplicates area of discomfort. The lateral margin of the left hallux toenail was narrowed from previous surgery, appears well-healed and is not tender to palpation.  Assessment: Ingrowing medial margin of the left hallux toenail  Plan: Offered patient permanent removal of the medial margin of the left hallux toenail and he verbally consents  The left hallux toe was blocked with 4 mL of 50-50 mixture of 2% plain Xylocaine and 0.5% plain Marcaine. The left hallux was painted with Betadine and exsanguinated. The medial border of the left hallux toenail was excised (no drainage noted) and a phenol matricectomy performed. An antibiotic compression dressing was applied. The tourniquet was released and spontaneous capillary fill time noted in the left hallux. Patient tolerated procedure without any difficulty  Postoperative oral reconstruction provided.  Reappoint at patient's request

## 2014-07-05 ENCOUNTER — Ambulatory Visit
Admission: RE | Admit: 2014-07-05 | Discharge: 2014-07-05 | Disposition: A | Discharge status: Home / Self Care | Payer: Medicare Other | Source: Ambulatory Visit | Attending: Family Medicine | Admitting: Family Medicine

## 2014-07-05 ENCOUNTER — Other Ambulatory Visit: Payer: Self-pay | Admitting: Family Medicine

## 2014-07-05 DIAGNOSIS — M25512 Pain in left shoulder: Secondary | ICD-10-CM

## 2015-07-19 ENCOUNTER — Other Ambulatory Visit: Payer: Self-pay | Admitting: Family Medicine

## 2015-07-19 ENCOUNTER — Other Ambulatory Visit: Payer: Medicare Other

## 2015-07-19 DIAGNOSIS — Z8546 Personal history of malignant neoplasm of prostate: Secondary | ICD-10-CM | POA: Diagnosis not present

## 2015-07-19 DIAGNOSIS — Z1211 Encounter for screening for malignant neoplasm of colon: Secondary | ICD-10-CM | POA: Diagnosis not present

## 2015-07-19 DIAGNOSIS — M109 Gout, unspecified: Secondary | ICD-10-CM | POA: Diagnosis not present

## 2015-07-19 DIAGNOSIS — N529 Male erectile dysfunction, unspecified: Secondary | ICD-10-CM | POA: Diagnosis not present

## 2015-07-19 DIAGNOSIS — R42 Dizziness and giddiness: Secondary | ICD-10-CM | POA: Diagnosis not present

## 2015-07-19 DIAGNOSIS — M1990 Unspecified osteoarthritis, unspecified site: Secondary | ICD-10-CM | POA: Diagnosis not present

## 2015-07-19 DIAGNOSIS — R519 Headache, unspecified: Secondary | ICD-10-CM

## 2015-07-19 DIAGNOSIS — R51 Headache: Principal | ICD-10-CM

## 2015-08-17 ENCOUNTER — Ambulatory Visit
Admission: RE | Admit: 2015-08-17 | Discharge: 2015-08-17 | Disposition: A | Discharge status: Home / Self Care | Payer: Medicare Other | Source: Ambulatory Visit | Attending: Family Medicine | Admitting: Family Medicine

## 2015-08-17 DIAGNOSIS — R519 Headache, unspecified: Secondary | ICD-10-CM

## 2015-08-17 DIAGNOSIS — R51 Headache: Secondary | ICD-10-CM | POA: Diagnosis not present

## 2015-10-16 DIAGNOSIS — Z1210 Encounter for screening for malignant neoplasm of intestinal tract, unspecified: Secondary | ICD-10-CM | POA: Diagnosis not present

## 2015-11-20 DIAGNOSIS — D126 Benign neoplasm of colon, unspecified: Secondary | ICD-10-CM | POA: Diagnosis not present

## 2015-11-20 DIAGNOSIS — K644 Residual hemorrhoidal skin tags: Secondary | ICD-10-CM | POA: Diagnosis not present

## 2015-11-20 DIAGNOSIS — D123 Benign neoplasm of transverse colon: Secondary | ICD-10-CM | POA: Diagnosis not present

## 2015-11-20 DIAGNOSIS — K5730 Diverticulosis of large intestine without perforation or abscess without bleeding: Secondary | ICD-10-CM | POA: Diagnosis not present

## 2015-11-20 DIAGNOSIS — K635 Polyp of colon: Secondary | ICD-10-CM | POA: Diagnosis not present

## 2015-11-20 DIAGNOSIS — Z1211 Encounter for screening for malignant neoplasm of colon: Secondary | ICD-10-CM | POA: Diagnosis not present

## 2015-11-20 DIAGNOSIS — D125 Benign neoplasm of sigmoid colon: Secondary | ICD-10-CM | POA: Diagnosis not present

## 2015-11-20 DIAGNOSIS — K648 Other hemorrhoids: Secondary | ICD-10-CM | POA: Diagnosis not present

## 2015-11-23 DIAGNOSIS — D126 Benign neoplasm of colon, unspecified: Secondary | ICD-10-CM | POA: Diagnosis not present

## 2015-11-23 DIAGNOSIS — K635 Polyp of colon: Secondary | ICD-10-CM | POA: Diagnosis not present

## 2015-11-23 DIAGNOSIS — Z1211 Encounter for screening for malignant neoplasm of colon: Secondary | ICD-10-CM | POA: Diagnosis not present

## 2015-12-25 DIAGNOSIS — C61 Malignant neoplasm of prostate: Secondary | ICD-10-CM | POA: Diagnosis not present

## 2015-12-25 DIAGNOSIS — N3281 Overactive bladder: Secondary | ICD-10-CM | POA: Diagnosis not present

## 2015-12-25 DIAGNOSIS — Z8546 Personal history of malignant neoplasm of prostate: Secondary | ICD-10-CM | POA: Diagnosis not present

## 2016-05-14 ENCOUNTER — Ambulatory Visit (INDEPENDENT_AMBULATORY_CARE_PROVIDER_SITE_OTHER): Payer: Medicare Other | Admitting: Orthopaedic Surgery

## 2016-05-14 ENCOUNTER — Ambulatory Visit (INDEPENDENT_AMBULATORY_CARE_PROVIDER_SITE_OTHER): Payer: Medicare Other

## 2016-05-14 ENCOUNTER — Encounter: Payer: Self-pay | Admitting: Orthopaedic Surgery

## 2016-05-14 VITALS — BP 142/87 | HR 63 | Temp 98.1°F | Ht 71.0 in | Wt 254.0 lb

## 2016-05-14 DIAGNOSIS — M25511 Pain in right shoulder: Secondary | ICD-10-CM

## 2016-05-14 DIAGNOSIS — M25512 Pain in left shoulder: Secondary | ICD-10-CM

## 2016-05-14 DIAGNOSIS — G8929 Other chronic pain: Secondary | ICD-10-CM

## 2016-05-14 MED ORDER — NAPROXEN 500 MG PO TABS: 500.0000 mg | ORAL_TABLET | Freq: Two times a day (BID) | ORAL | 5 refills | Status: DC

## 2016-05-14 NOTE — Progress Notes (Signed)
Subjective:    Patient ID: David Mata, male    DOB: 12/05/1944, 71 y.o.   MRN: [id number]  HPI He has long history of bilateral shoulder pain.  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 He was told he has DJD of the AC joints of the shoulders.  He has had more pain in the shoulders over the last several months.  The left side hurt more until recently and now the right shoulder hurts.  He has pain raising his arms over his head. He fell in the snow and hurt his right shoulder about six weeks ago.  It hurts but it is improving.    He has tried ice, heat, rubs and Aleve, all of which help some.  He has no numbness, no swelling of the shoulders.  He has history of neck problems and has seen neurosurgeon for this in the past.  He has history of prostate cancer and is doing well from that.  Review of Systems  HENT: Negative for congestion.   Respiratory: Negative for cough and shortness of breath.   Cardiovascular: Negative for chest pain and leg swelling.  Endocrine: Negative for cold intolerance.  Musculoskeletal: Positive for arthralgias and joint swelling.  Allergic/Immunologic: Negative for environmental allergies.   Past Medical History:  Diagnosis Date  . Arthritis   . Erectile dysfunction   . Gout   . Gout   . Hypoglycemia   . Prostate cancer [facility])     Past Surgical History:  Procedure Laterality Date  . ACHILLES TENDON REPAIR  2011   right heel   . CHOLECYSTECTOMY  12/07/10   with IOC  . ELBOW SURGERY  2008, 2010   Gout  both elbows  . FOOT SURGERY    . HAND SURGERY  2010   gout   . INSERTION PROSTATE RADIATION SEED  2004  . PROSTATE SURGERY      Current Outpatient Prescriptions on File Prior to Visit  Medication Sig Dispense Refill  . Febuxostat (ULORIC) 80 MG TABS Take 80 mg by mouth daily.       No current facility-administered medications on file prior to visit.     Social History   Social History  . Marital status: Single     Spouse name: N/A  . Number of children: N/A  . Years of education: N/A   Occupational History  . Not on file.   Social History Main Topics  . Smoking status: Former Smoker    Types: Cigars  . Smokeless tobacco: Never Used  . Alcohol use Yes  . Drug use: No  . Sexual activity: Not on file   Other Topics Concern  . Not on file   Social History Narrative  . No narrative on file    Family History  Problem Relation Age of Onset  . Heart failure Mother   . Heart failure Father   . Heart attack Father   . Cancer Brother   . Cancer Paternal Grandfather   . Heart attack Paternal Grandfather   . Diabetes Maternal Grandfather     BP (!) 142/87   Pulse 63   Temp 98.1 F (36.7 C)   Ht 5\' 11"  (1.803 m)   Wt 254 lb (115.2 kg)   BMI 35.43 kg/m      Objective:   Physical Exam  Constitutional: He is oriented to person, place, and time. He appears well-developed and well-nourished.  HENT:  Head: Normocephalic and atraumatic.  Eyes: Conjunctivae  and EOM are normal. Pupils are equal, round, and reactive to light.  Neck: Normal range of motion. Neck supple.  Cardiovascular: Normal rate, regular rhythm and intact distal pulses.   Pulmonary/Chest: Effort normal.  Abdominal: Soft.  Musculoskeletal: He exhibits tenderness (ROM of both shoulders is tender, ROM of right more painful than left. Deformities of both AC joints with swelling, no redness, no effusion.  Extension is limited in both shoulders, full but painful ROM of the shoulders otherwise.  NV intact.  Grips are  OK).  Neurological: He is alert and oriented to person, place, and time. He has normal reflexes. He displays normal reflexes. No cranial nerve deficit. He exhibits normal muscle tone. Coordination normal.  Skin: Skin is warm and dry.  Psychiatric: He has a normal mood and affect. His behavior is normal. Judgment and thought content normal.  Vitals reviewed.   X-rays were done of both shoulders, reported  separately.      Assessment & Plan:   Encounter Diagnosis  Name Primary?  . Chronic pain of both shoulders Yes   PROCEDURE NOTE:  The patient request injection, verbal consent was obtained.  The right shoulder was prepped appropriately after time out was performed.   Sterile technique was observed and injection of 1 cc of Depo-Medrol 40 mg with several cc's of plain xylocaine. Anesthesia was provided by ethyl chloride and a 20-gauge needle was used to inject the shoulder area. A posterior approach was used.  The injection was tolerated well.  A band aid dressing was applied.  The patient was advised to apply ice later today and tomorrow to the injection sight as needed.  PROCEDURE NOTE:  The patient request injection, verbal consent was obtained.  The left shoulder was prepped appropriately after time out was performed.   Sterile technique was observed and injection of 1 cc of Depo-Medrol 40 mg with several cc's of plain xylocaine. Anesthesia was provided by ethyl chloride and a 20-gauge needle was used to inject the shoulder area. A posterior approach was used.  The injection was tolerated well.  A band aid dressing was applied.  The patient was advised to apply ice later today and tomorrow to the injection sight as needed.  I will begin Naprosyn. Stop the Aleve.  Take Tylenol if has headache.  Return in one month.  Consider MRI if not improved.  Call if any problem.  Precautions discussed.  Electronically Signed Sanjuana Kava, MD 4/17/201812:01 PM

## 2016-05-21 ENCOUNTER — Telehealth (HOSPITAL_COMMUNITY): Payer: Self-pay | Admitting: Occupational Therapy

## 2016-05-21 NOTE — Telephone Encounter (Signed)
Patient is not feeling well and want to call us back in a few days to reschedule.

## 2016-05-22 ENCOUNTER — Ambulatory Visit (HOSPITAL_COMMUNITY): Payer: Medicare Other | Admitting: Occupational Therapy

## 2016-06-11 ENCOUNTER — Encounter: Payer: Self-pay | Admitting: Orthopaedic Surgery

## 2016-06-11 ENCOUNTER — Ambulatory Visit (INDEPENDENT_AMBULATORY_CARE_PROVIDER_SITE_OTHER): Payer: Medicare Other | Admitting: Orthopaedic Surgery

## 2016-06-11 VITALS — BP 159/97 | HR 73 | Temp 96.8°F | Ht 71.0 in | Wt 247.0 lb

## 2016-06-11 DIAGNOSIS — M25512 Pain in left shoulder: Secondary | ICD-10-CM | POA: Diagnosis not present

## 2016-06-11 DIAGNOSIS — G8929 Other chronic pain: Secondary | ICD-10-CM | POA: Diagnosis not present

## 2016-06-11 DIAGNOSIS — M25511 Pain in right shoulder: Secondary | ICD-10-CM

## 2016-06-11 NOTE — Progress Notes (Signed)
Patient David Mata, male DOB:May 02, 1945, 71 y.o. IEP:[id number]  Chief Complaint  Patient presents with  . Follow-up    Shoulder pain    HPI  David Mata is a 71 y.o. male who has had pain in both shoulders.  He is much improved after the injection last time and the Aleve.  He has better motion, much less pain, no paresthesias.  He is active and doing well.  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 HPI  Body mass index is 34.45 kg/m.  ROS  Review of Systems  HENT: Negative for congestion.   Respiratory: Negative for cough and shortness of breath.   Cardiovascular: Negative for chest pain and leg swelling.  Endocrine: Negative for cold intolerance.  Musculoskeletal: Positive for arthralgias and joint swelling.  Allergic/Immunologic: Negative for environmental allergies.    Past Medical History:  Diagnosis Date  . Arthritis   . Erectile dysfunction   . Gout   . Gout   . Hypoglycemia   . Prostate cancer [facility])     Past Surgical History:  Procedure Laterality Date  . ACHILLES TENDON REPAIR  2011   right heel   . CHOLECYSTECTOMY  12/07/10   with IOC  . ELBOW SURGERY  2008, 2010   Gout  both elbows  . FOOT SURGERY    . HAND SURGERY  2010   gout   . INSERTION PROSTATE RADIATION SEED  2004  . PROSTATE SURGERY      Family History  Problem Relation Age of Onset  . Heart failure Mother   . Heart failure Father   . Heart attack Father   . Cancer Brother   . Cancer Paternal Grandfather   . Heart attack Paternal Grandfather   . Diabetes Maternal Grandfather     Social History Social History  Substance Use Topics  . Smoking status: Former Smoker    Types: Cigars  . Smokeless tobacco: Never Used  . Alcohol use Yes    No Known Allergies  Current Outpatient Prescriptions  Medication Sig Dispense Refill  . Febuxostat (ULORIC) 80 MG TABS Take 80 mg by mouth daily.      . naproxen (NAPROSYN) 500  MG tablet Take 1 tablet (500 mg total) by mouth 2 (two) times daily with a meal. 60 tablet 5   No current facility-administered medications for this visit.      Physical Exam  Blood pressure (!) 159/97, pulse 73, temperature (!) 96.8 F (36 C), height 5\' 11"  (1.803 m), weight 247 lb (112 kg).  Constitutional: overall normal hygiene, normal nutrition, well developed, normal grooming, normal body habitus. Assistive device:none  Musculoskeletal: gait and station Limp none, muscle tone and strength are normal, no tremors or atrophy is present.  .  Neurological: coordination overall normal.  Deep tendon reflex/nerve stretch intact.  Sensation normal.  Cranial nerves II-XII intact.   Skin:   Normal overall no scars, lesions, ulcers or rashes. No psoriasis.  Psychiatric: Alert and oriented x 3.  Recent memory intact, remote memory unclear.  Normal mood and affect. Well groomed.  Good eye contact.  Cardiovascular: overall no swelling, no varicosities, no edema bilaterally, normal temperatures of the legs and arms, no clubbing, cyanosis and good capillary refill.  Lymphatic: palpation is normal.  He has full motion of both shoulder, normal strength and tone, no swelling, no paresthesias.  NV intact.  The patient has been educated about the nature of the  problem(s) and counseled on treatment options.  The patient appeared to understand what I have discussed and is in agreement with it.  Encounter Diagnosis  Name Primary?  . Chronic pain of both shoulders Yes    PLAN Call if any problems.  Precautions discussed.  Continue current medications.   Return to clinic prn   Electronically Signed Sanjuana Kava, MD 5/15/20189:29 AM

## 2016-09-16 DIAGNOSIS — Z131 Encounter for screening for diabetes mellitus: Secondary | ICD-10-CM | POA: Diagnosis not present

## 2016-09-16 DIAGNOSIS — M109 Gout, unspecified: Secondary | ICD-10-CM | POA: Diagnosis not present

## 2016-09-16 DIAGNOSIS — Z8546 Personal history of malignant neoplasm of prostate: Secondary | ICD-10-CM | POA: Diagnosis not present

## 2016-09-16 DIAGNOSIS — Z0000 Encounter for general adult medical examination without abnormal findings: Secondary | ICD-10-CM | POA: Diagnosis not present

## 2016-09-16 DIAGNOSIS — Z1159 Encounter for screening for other viral diseases: Secondary | ICD-10-CM | POA: Diagnosis not present

## 2016-10-10 ENCOUNTER — Ambulatory Visit (INDEPENDENT_AMBULATORY_CARE_PROVIDER_SITE_OTHER): Payer: Medicare Other | Admitting: Orthopaedic Surgery

## 2016-10-10 ENCOUNTER — Encounter: Payer: Self-pay | Admitting: Orthopaedic Surgery

## 2016-10-10 ENCOUNTER — Ambulatory Visit (INDEPENDENT_AMBULATORY_CARE_PROVIDER_SITE_OTHER): Payer: Medicare Other

## 2016-10-10 VITALS — BP 131/75 | HR 61 | Temp 97.2°F | Ht 71.0 in | Wt 253.0 lb

## 2016-10-10 DIAGNOSIS — S92501A Displaced unspecified fracture of right lesser toe(s), initial encounter for closed fracture: Secondary | ICD-10-CM

## 2016-10-10 DIAGNOSIS — M25571 Pain in right ankle and joints of right foot: Secondary | ICD-10-CM

## 2016-10-10 DIAGNOSIS — S92414A Nondisplaced fracture of proximal phalanx of right great toe, initial encounter for closed fracture: Secondary | ICD-10-CM

## 2016-10-10 DIAGNOSIS — S92314A Nondisplaced fracture of first metatarsal bone, right foot, initial encounter for closed fracture: Secondary | ICD-10-CM

## 2016-10-10 NOTE — Progress Notes (Signed)
Patient David Mata, male DOB:01-Sep-1945, 72 y.o. QIO:[id number]  Chief Complaint  Patient presents with  . New Problem    Right foot injury due to fall    HPI  David Mata is a 71 y.o. male who stood up from eating in the kitchen of his home several days ago.  He felt like he was choking.  He had pain in the upper left chest.  Then he fell.  He hurt his right foot.  He thinks he "blacked out" for a few seconds and woke up.  He had rolled over on the floor.  He got up.  He had pain in his right foot that started to get bruising that evening and more by the next morning.  He had swelling in the right medial foot and great toe.  He had no other pains.  He has not had such an episode before.  He has no memory problems.  He has been able to swallow without problem.  He has no weakness.  Other than his right foot he says he is fine.  He had a recent medical annual wellness exam and did well. HPI  Body mass index is 35.29 kg/m.  ROS  Review of Systems  HENT: Negative for congestion.   Respiratory: Negative for cough and shortness of breath.   Cardiovascular: Negative for chest pain and leg swelling.  Endocrine: Negative for cold intolerance.  Musculoskeletal: Positive for arthralgias and joint swelling.  Allergic/Immunologic: Negative for environmental allergies.    Past Medical History:  Diagnosis Date  . Arthritis   . Erectile dysfunction   . Gout   . Gout   . Hypoglycemia   . Prostate cancer [facility])     Past Surgical History:  Procedure Laterality Date  . ACHILLES TENDON REPAIR  2011   right heel   . CHOLECYSTECTOMY  12/07/10   with IOC  . ELBOW SURGERY  2008, 2010   Gout  both elbows  . FOOT SURGERY    . HAND SURGERY  2010   gout   . INSERTION PROSTATE RADIATION SEED  2004  . PROSTATE SURGERY      Family History  Problem Relation Age of Onset  . Heart failure Mother   . Heart failure Father   . Heart attack Father   . Cancer Brother   . Cancer Paternal  Grandfather   . Heart attack Paternal Grandfather   . Diabetes Maternal Grandfather     Social History Social History  Substance Use Topics  . Smoking status: Former Smoker    Types: Cigars  . Smokeless tobacco: Never Used  . Alcohol use Yes    No Known Allergies  Current Outpatient Prescriptions  Medication Sig Dispense Refill  . Febuxostat (ULORIC) 80 MG TABS Take 80 mg by mouth daily.      . naproxen (NAPROSYN) 500 MG tablet Take 1 tablet (500 mg total) by mouth 2 (two) times daily with a meal. 60 tablet 5   No current facility-administered medications for this visit.      Physical Exam  Blood pressure 131/75, pulse 61, temperature (!) 97.2 F (36.2 C), height 5\' 11"  (1.803 m), weight 253 lb (114.8 kg).  Constitutional: overall normal hygiene, normal nutrition, well developed, normal grooming, normal body habitus. Assistive device:none  Musculoskeletal: gait and station Limp right, muscle tone and strength are normal, no tremors or atrophy is present.  .  Neurological: coordination overall normal.  Deep tendon reflex/nerve stretch intact.  Sensation normal.  Cranial nerves II-XII intact.   Skin:   Normal overall no scars, lesions, ulcers or rashes. No psoriasis.  Psychiatric: Alert and oriented x 3.  Recent memory intact, remote memory unclear.  Normal mood and affect. Well groomed.  Good eye contact.  Cardiovascular: overall no swelling, no varicosities, no edema bilaterally, normal temperatures of the legs and arms, no clubbing, cyanosis and good capillary refill.  Lymphatic: palpation is normal.  All other systems reviewed and are negative   The right medial foot and the right great toe have extensive ecchymosis and swelling.  NV is intact. ROM is full but tender of the great toe.  He has no redness.  The patient has been educated about the nature of the problem(s) and counseled on treatment options.  The patient appeared to understand what I have discussed and  is in agreement with it.  X-rays were done of the right foot, reported separately.  Encounter Diagnoses  Name Primary?  . Pain in joint involving right ankle and foot Yes  . Closed nondisplaced fracture of first metatarsal bone of right foot, initial encounter   . Closed nondisplaced fracture of proximal phalanx of right great toe, initial encounter   . Closed fracture of phalanx of right second toe, initial encounter    Post op shoe given.  I have shown him the X-rays and explained treatment.  PLAN Call if any problems.  Precautions discussed.  Continue current medications.   Return to clinic 3 weeks   X-rays on return.  Electronically Signed Sanjuana Kava, MD 9/13/20182:39 PM

## 2016-10-31 ENCOUNTER — Ambulatory Visit: Payer: Medicare Other | Admitting: Orthopaedic Surgery

## 2016-11-05 ENCOUNTER — Ambulatory Visit (INDEPENDENT_AMBULATORY_CARE_PROVIDER_SITE_OTHER): Payer: Medicare Other

## 2016-11-05 ENCOUNTER — Ambulatory Visit (INDEPENDENT_AMBULATORY_CARE_PROVIDER_SITE_OTHER): Payer: Medicare Other | Admitting: Orthopaedic Surgery

## 2016-11-05 ENCOUNTER — Encounter: Payer: Self-pay | Admitting: Orthopaedic Surgery

## 2016-11-05 VITALS — BP 153/91 | HR 66 | Temp 97.8°F | Ht 71.0 in | Wt 252.0 lb

## 2016-11-05 DIAGNOSIS — S92501D Displaced unspecified fracture of right lesser toe(s), subsequent encounter for fracture with routine healing: Secondary | ICD-10-CM

## 2016-11-05 DIAGNOSIS — S92414D Nondisplaced fracture of proximal phalanx of right great toe, subsequent encounter for fracture with routine healing: Secondary | ICD-10-CM | POA: Diagnosis not present

## 2016-11-05 DIAGNOSIS — S92314D Nondisplaced fracture of first metatarsal bone, right foot, subsequent encounter for fracture with routine healing: Secondary | ICD-10-CM

## 2016-11-05 NOTE — Progress Notes (Signed)
CC:  My toes do not hurt  He is doing well and walking well.  X-rays were done and reported separately.  NV is intact.  Gait is good.  Encounter Diagnoses  Name Primary?  . Closed nondisplaced fracture of first metatarsal bone of right foot with routine healing, subsequent encounter Yes  . Closed nondisplaced fracture of proximal phalanx of right great toe with routine healing, subsequent encounter   . Closed fracture of phalanx of right second toe with routine healing, subsequent encounter    Discharge.  Electronically Signed Sanjuana Kava, MD 10/9/201810:18 AM

## 2017-02-05 ENCOUNTER — Telehealth: Payer: Self-pay | Admitting: Radiology

## 2017-02-05 ENCOUNTER — Ambulatory Visit: Payer: Medicare Other | Admitting: Orthopaedic Surgery

## 2017-02-05 ENCOUNTER — Ambulatory Visit (INDEPENDENT_AMBULATORY_CARE_PROVIDER_SITE_OTHER): Payer: Medicare Other

## 2017-02-05 ENCOUNTER — Encounter: Payer: Self-pay | Admitting: Orthopaedic Surgery

## 2017-02-05 VITALS — BP 134/83 | HR 78 | Ht 71.0 in | Wt 250.0 lb

## 2017-02-05 DIAGNOSIS — M5442 Lumbago with sciatica, left side: Secondary | ICD-10-CM | POA: Diagnosis not present

## 2017-02-05 MED ORDER — CYCLOBENZAPRINE HCL 10 MG PO TABS: ORAL_TABLET | ORAL | 0 refills | Status: DC

## 2017-02-05 MED ORDER — HYDROCODONE-ACETAMINOPHEN 5-325 MG PO TABS: ORAL_TABLET | ORAL | 0 refills | Status: DC

## 2017-02-05 MED ORDER — PREDNISONE 5 MG (21) PO TBPK: ORAL_TABLET | ORAL | 0 refills | Status: DC

## 2017-02-05 MED ORDER — NAPROXEN 500 MG PO TABS: 500.0000 mg | ORAL_TABLET | Freq: Two times a day (BID) | ORAL | 5 refills | Status: DC

## 2017-02-05 NOTE — Progress Notes (Signed)
Patient David David Mata, male DOB:01/31/1945, 72 y.o. [id number]  Chief Complaint  Patient presents with  . NEW PROBLEM    left hip and back pain    HPI  David David Mata is a 72 y.o. male who presents with acute lower back pain with no sciatica.  He rescues horses. With the recent snow we had, he had a new horse to take care of.  He has been out in the mud and also working and driving distances.  He had acute lower back pain this past weekend and was unable to do hardly anything for a day.  He is hurting but is better today.  He has no bowel or bladder problem, no weakness.  He has taken Aleve which has helped.   HPI  Body mass index is 34.87 kg/m.  ROS  Review of Systems  HENT: Negative for congestion.   Respiratory: Negative for cough and shortness of breath.   Cardiovascular: Negative for chest pain and leg swelling.  Endocrine: Negative for cold intolerance.  Musculoskeletal: Positive for arthralgias and joint swelling.  Allergic/Immunologic: Negative for environmental allergies.  All other systems reviewed and are negative.   Past Medical History:  Diagnosis Date  . Arthritis   . Erectile dysfunction   . Gout   . Gout   . Hypoglycemia   . Prostate cancer [facility])     Past Surgical History:  Procedure Laterality Date  . ACHILLES TENDON REPAIR  2011   right heel   . CHOLECYSTECTOMY  12/07/10   with IOC  . ELBOW SURGERY  2008, 2010   Gout  both elbows  . FOOT SURGERY    . HAND SURGERY  2010   gout   . INSERTION PROSTATE RADIATION SEED  2004  . PROSTATE SURGERY      Family History  Problem Relation Age of Onset  . Heart failure Mother   . Heart failure Father   . Heart attack Father   . Cancer Brother   . Cancer Paternal Grandfather   . Heart attack Paternal Grandfather   . Diabetes Maternal Grandfather     Social History Social History   Tobacco Use  . Smoking status: Former Smoker    Types: Cigars  . Smokeless tobacco: Never Used  Substance Use  Topics  . Alcohol use: Yes  . Drug use: No    No Known Allergies  Current Outpatient Medications  Medication Sig Dispense Refill  . cyclobenzaprine (FLEXERIL) 10 MG tablet On tablet by mouth at bedtime for back pain spasm. 30 tablet 0  . Febuxostat (ULORIC) 80 MG TABS Take 80 mg by mouth daily.      Marland Kitchen HYDROcodone-acetaminophen (NORCO/VICODIN) 5-325 MG tablet One tablet every four hours as needed for acute pain.  Limit of five days per [place] statue. 30 tablet 0  . naproxen (NAPROSYN) 500 MG tablet Take 1 tablet (500 mg total) by mouth 2 (two) times daily with a meal. 60 tablet 5  . predniSONE (STERAPRED UNI-PAK 21 TAB) 5 MG (21) TBPK tablet Take 6 pills first day; 5 pills second day; 4 pills third day; 3 pills fourth day; 2 pills next day and 1 pill last day. 21 tablet 0   No current facility-administered medications for this visit.      Physical Exam  Blood pressure 134/83, pulse 78, height 5\' 11"  (1.803 m), weight 250 lb (113.4 kg).  Constitutional: overall normal hygiene, normal nutrition, well developed, normal grooming, normal body habitus. Assistive device:none  Musculoskeletal: gait and station Limp none, muscle tone and strength are normal, no tremors or atrophy is present.  .  Neurological: coordination overall normal.  Deep tendon reflex/nerve stretch intact.  Sensation normal.  Cranial nerves II-XII intact.   Skin:   Normal overall no scars, lesions, ulcers or rashes. No psoriasis.  Psychiatric: Alert and oriented x 3.  Recent memory intact, remote memory unclear.  Normal mood and affect. Well groomed.  Good eye contact.  Cardiovascular: overall no swelling, no varicosities, no edema bilaterally, normal temperatures of the legs and arms, no clubbing, cyanosis and good capillary refill.  Lymphatic: palpation is normal.  Spine/Pelvis examination:  Inspection:  Overall, sacoiliac joint benign and hips nontender; without crepitus or defects.   Thoracic spine  inspection: Alignment normal without kyphosis present   Lumbar spine inspection:  Alignment  with normal lumbar lordosis, without scoliosis apparent.   Thoracic spine palpation:  without tenderness of spinal processes   Lumbar spine palpation: with tenderness of lumbar area; with tightness of lumbar muscles    Range of Motion:   Lumbar flexion, forward flexion is 20 with pain or tenderness    Lumbar extension is 5 with pain or tenderness   Left lateral bend is Normal  with pain or tenderness   Right lateral bend is Normal with pain or tenderness   Straight leg raising is Normal   Strength & tone: Normal   Stability overall normal stability    X-rays were done of the lumbar spine, reported separately.  All other systems reviewed and are negative   The patient has been educated about the nature of the problem(s) and counseled on treatment options.  The patient appeared to understand what I have discussed and is in agreement with it.  Encounter Diagnosis  Name Primary?  . Acute left-sided low back pain with left-sided sciatica Yes    PLAN Call if any problems.  Precautions discussed.  Begin Prednisone dose pack then when that if finished begin Naprosyn. Rx for pain medicine and Flexeril given with precautions.  Return to clinic 2 weeks   I have reviewed the [place] web site prior to prescribing narcotic medicine for this patient.  Electronically Signed Sanjuana Kava, MD 1/9/20199:50 AM

## 2017-02-06 NOTE — Telephone Encounter (Signed)
Disregard preAuth request per patient.  He paid cash for the cyclobenzaprine and does not want to change to a covered medication.

## 2017-02-11 ENCOUNTER — Ambulatory Visit: Payer: Medicare Other | Admitting: Orthopaedic Surgery

## 2017-02-18 ENCOUNTER — Encounter: Payer: Self-pay | Admitting: Orthopaedic Surgery

## 2017-02-18 ENCOUNTER — Ambulatory Visit: Payer: Medicare Other | Admitting: Orthopaedic Surgery

## 2017-02-18 VITALS — BP 143/97 | HR 71 | Ht 71.0 in | Wt 248.0 lb

## 2017-02-18 DIAGNOSIS — M5442 Lumbago with sciatica, left side: Secondary | ICD-10-CM | POA: Diagnosis not present

## 2017-02-18 NOTE — Progress Notes (Signed)
Patient David Mata, male DOB:1945/04/19, 72 y.o. WYO:[id number]  Chief Complaint  Patient presents with  . Follow-up    Recheck on back.    HPI  David Mata is a 72 y.o. male who has lower back pain acute with left sided sciatica.  He is a little better.  He has pain at times, more after sitting a while.  He has learned to use ice and move about more.  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  He drives around a lot.  I have told him to get out of the car more often and walk around.   HPI  Body mass index is 34.59 kg/m.  ROS  Review of Systems  HENT: Negative for congestion.   Respiratory: Negative for cough and shortness of breath.   Cardiovascular: Negative for chest pain and leg swelling.  Endocrine: Negative for cold intolerance.  Musculoskeletal: Positive for arthralgias and joint swelling.  Allergic/Immunologic: Negative for environmental allergies.  All other systems reviewed and are negative.   Past Medical History:  Diagnosis Date  . Arthritis   . Erectile dysfunction   . Gout   . Gout   . Hypoglycemia   . Prostate cancer [facility])     Past Surgical History:  Procedure Laterality Date  . ACHILLES TENDON REPAIR  2011   right heel   . CHOLECYSTECTOMY  12/07/10   with IOC  . ELBOW SURGERY  2008, 2010   Gout  both elbows  . FOOT SURGERY    . HAND SURGERY  2010   gout   . INSERTION PROSTATE RADIATION SEED  2004  . PROSTATE SURGERY      Family History  Problem Relation Age of Onset  . Heart failure Mother   . Heart failure Father   . Heart attack Father   . Cancer Brother   . Cancer Paternal Grandfather   . Heart attack Paternal Grandfather   . Diabetes Maternal Grandfather     Social History Social History   Tobacco Use  . Smoking status: Former Smoker    Types: Cigars  . Smokeless tobacco: Never Used  Substance Use Topics  . Alcohol use: Yes  . Drug use: No    No Known Allergies  Current Outpatient Medications   Medication Sig Dispense Refill  . cyclobenzaprine (FLEXERIL) 10 MG tablet On tablet by mouth at bedtime for back pain spasm. 30 tablet 0  . Febuxostat (ULORIC) 80 MG TABS Take 80 mg by mouth daily.      Marland Kitchen HYDROcodone-acetaminophen (NORCO/VICODIN) 5-325 MG tablet One tablet every four hours as needed for acute pain.  Limit of five days per [place] statue. 30 tablet 0  . naproxen (NAPROSYN) 500 MG tablet Take 1 tablet (500 mg total) by mouth 2 (two) times daily with a meal. 60 tablet 5  . predniSONE (STERAPRED UNI-PAK 21 TAB) 5 MG (21) TBPK tablet Take 6 pills first day; 5 pills second day; 4 pills third day; 3 pills fourth day; 2 pills next day and 1 pill last day. 21 tablet 0   No current facility-administered medications for this visit.      Physical Exam  Blood pressure (!) 143/97, pulse 71, height 5\' 11"  (1.803 m), weight 248 lb (112.5 kg).  Constitutional: overall normal hygiene, normal nutrition, well developed, normal grooming, normal body habitus. Assistive device:none  Musculoskeletal: gait and station Limp none, muscle tone and strength are normal, no tremors or atrophy is present.  Marland Kitchen  Neurological: coordination overall normal.  Deep tendon reflex/nerve stretch intact.  Sensation normal.  Cranial nerves II-XII intact.   Skin:   Normal overall no scars, lesions, ulcers or rashes. No psoriasis.  Psychiatric: Alert and oriented x 3.  Recent memory intact, remote memory unclear.  Normal mood and affect. Well groomed.  Good eye contact.  Cardiovascular: overall no swelling, no varicosities, no edema bilaterally, normal temperatures of the legs and arms, no clubbing, cyanosis and good capillary refill.  Lymphatic: palpation is normal.  Spine/Pelvis examination:  Inspection:  Overall, sacoiliac joint benign and hips nontender; without crepitus or defects.   Thoracic spine inspection: Alignment normal without kyphosis present   Lumbar spine inspection:  Alignment  with normal  lumbar lordosis, without scoliosis apparent.   Thoracic spine palpation:  without tenderness of spinal processes   Lumbar spine palpation: without tenderness of lumbar area; without tightness of lumbar muscles    Range of Motion:   Lumbar flexion, forward flexion is normal without pain or tenderness    Lumbar extension is full without pain or tenderness   Left lateral bend is normal without pain or tenderness   Right lateral bend is normal without pain or tenderness   Straight leg raising is normal  Strength & tone: normal   Stability overall normal stability All other systems reviewed and are negative   The patient has been educated about the nature of the problem(s) and counseled on treatment options.  The patient appeared to understand what I have discussed and is in agreement with it.  Encounter Diagnosis  Name Primary?  . Acute left-sided low back pain with left-sided sciatica Yes    PLAN Call if any problems.  Precautions discussed.  Continue current medications.   Return to clinic 1 month   Electronically Signed Sanjuana Kava, MD 1/22/201910:02 AM

## 2017-07-22 DIAGNOSIS — N3281 Overactive bladder: Secondary | ICD-10-CM | POA: Diagnosis not present

## 2017-07-22 DIAGNOSIS — N5201 Erectile dysfunction due to arterial insufficiency: Secondary | ICD-10-CM | POA: Diagnosis not present

## 2017-07-22 DIAGNOSIS — Z8546 Personal history of malignant neoplasm of prostate: Secondary | ICD-10-CM | POA: Diagnosis not present

## 2017-07-22 DIAGNOSIS — R310 Gross hematuria: Secondary | ICD-10-CM | POA: Diagnosis not present

## 2017-10-17 DIAGNOSIS — M109 Gout, unspecified: Secondary | ICD-10-CM | POA: Diagnosis not present

## 2017-10-17 DIAGNOSIS — Z131 Encounter for screening for diabetes mellitus: Secondary | ICD-10-CM | POA: Diagnosis not present

## 2017-10-17 DIAGNOSIS — Z0000 Encounter for general adult medical examination without abnormal findings: Secondary | ICD-10-CM | POA: Diagnosis not present

## 2017-10-17 DIAGNOSIS — Z8546 Personal history of malignant neoplasm of prostate: Secondary | ICD-10-CM | POA: Diagnosis not present

## 2018-02-02 DIAGNOSIS — M9901 Segmental and somatic dysfunction of cervical region: Secondary | ICD-10-CM | POA: Diagnosis not present

## 2018-02-02 DIAGNOSIS — M9902 Segmental and somatic dysfunction of thoracic region: Secondary | ICD-10-CM | POA: Diagnosis not present

## 2018-02-02 DIAGNOSIS — M542 Cervicalgia: Secondary | ICD-10-CM | POA: Diagnosis not present

## 2018-02-02 DIAGNOSIS — M546 Pain in thoracic spine: Secondary | ICD-10-CM | POA: Diagnosis not present

## 2018-02-06 DIAGNOSIS — M542 Cervicalgia: Secondary | ICD-10-CM | POA: Diagnosis not present

## 2018-02-06 DIAGNOSIS — M9902 Segmental and somatic dysfunction of thoracic region: Secondary | ICD-10-CM | POA: Diagnosis not present

## 2018-02-06 DIAGNOSIS — M546 Pain in thoracic spine: Secondary | ICD-10-CM | POA: Diagnosis not present

## 2018-02-06 DIAGNOSIS — M9901 Segmental and somatic dysfunction of cervical region: Secondary | ICD-10-CM | POA: Diagnosis not present

## 2018-06-29 DIAGNOSIS — N5201 Erectile dysfunction due to arterial insufficiency: Secondary | ICD-10-CM | POA: Diagnosis not present

## 2018-06-29 DIAGNOSIS — Z8546 Personal history of malignant neoplasm of prostate: Secondary | ICD-10-CM | POA: Diagnosis not present

## 2018-06-29 DIAGNOSIS — R350 Frequency of micturition: Secondary | ICD-10-CM | POA: Diagnosis not present

## 2018-12-23 DIAGNOSIS — M109 Gout, unspecified: Secondary | ICD-10-CM | POA: Diagnosis not present

## 2018-12-23 DIAGNOSIS — Z8546 Personal history of malignant neoplasm of prostate: Secondary | ICD-10-CM | POA: Diagnosis not present

## 2018-12-23 DIAGNOSIS — Z131 Encounter for screening for diabetes mellitus: Secondary | ICD-10-CM | POA: Diagnosis not present

## 2018-12-23 DIAGNOSIS — Z0000 Encounter for general adult medical examination without abnormal findings: Secondary | ICD-10-CM | POA: Diagnosis not present

## 2019-04-23 DIAGNOSIS — M5136 Other intervertebral disc degeneration, lumbar region: Secondary | ICD-10-CM | POA: Diagnosis not present

## 2019-04-23 DIAGNOSIS — M545 Low back pain: Secondary | ICD-10-CM | POA: Diagnosis not present

## 2019-04-23 DIAGNOSIS — M5412 Radiculopathy, cervical region: Secondary | ICD-10-CM | POA: Diagnosis not present

## 2019-04-23 DIAGNOSIS — M62830 Muscle spasm of back: Secondary | ICD-10-CM | POA: Diagnosis not present

## 2019-05-20 DIAGNOSIS — M5136 Other intervertebral disc degeneration, lumbar region: Secondary | ICD-10-CM | POA: Diagnosis not present

## 2019-06-02 ENCOUNTER — Ambulatory Visit: Payer: Medicare Other | Admitting: Physician Assistant

## 2019-06-02 ENCOUNTER — Other Ambulatory Visit: Payer: Self-pay

## 2019-06-02 ENCOUNTER — Encounter: Payer: Self-pay | Admitting: Physician Assistant

## 2019-06-02 DIAGNOSIS — L570 Actinic keratosis: Secondary | ICD-10-CM | POA: Diagnosis not present

## 2019-06-02 DIAGNOSIS — D485 Neoplasm of uncertain behavior of skin: Secondary | ICD-10-CM | POA: Diagnosis not present

## 2019-06-02 DIAGNOSIS — L820 Inflamed seborrheic keratosis: Secondary | ICD-10-CM | POA: Diagnosis not present

## 2019-06-02 NOTE — Patient Instructions (Signed)

## 2019-06-02 NOTE — Progress Notes (Addendum)
   New Patient Visit  Subjective  David Mata is a 74 y.o. male who presents for the following: Annual Exam (left chest patient picks new for 1 month). Spot on left chest. Came up about 1 month ago. It looked like a cut and then it got a growth over it. His wife is concerned. The lesion is not sore. He does scratch it on occasion and it has bled.    Objective  Well appearing patient in no apparent distress; mood and affect are within normal limits.  All skin waist up examined. No suspicious moles noted on back.  Objective  Left Breast: Pink scabbed patch     Objective  Scalp: Erythematous patches with gritty scale.  Assessment & Plan  Neoplasm of uncertain behavior of skin Left Breast  Skin / nail biopsy Type of biopsy: tangential   Informed consent: discussed and consent obtained   Procedure prep:  Patient was prepped and draped in usual sterile fashion (Non sterile) Prep type:  Chlorhexidine Anesthesia: the lesion was anesthetized in a standard fashion   Anesthetic:  1% lidocaine w/ epinephrine 1-100,000 local infiltration Instrument used: flexible razor blade    Specimen 1 - Surgical pathology Differential Diagnosis: r/o bcc vs scc Check Margins: No  AK (actinic keratosis) Scalp  We discussed using 5FU cream and will wait until fall for that.

## 2019-12-27 DIAGNOSIS — Z8546 Personal history of malignant neoplasm of prostate: Secondary | ICD-10-CM | POA: Diagnosis not present

## 2019-12-27 DIAGNOSIS — Z0000 Encounter for general adult medical examination without abnormal findings: Secondary | ICD-10-CM | POA: Diagnosis not present

## 2019-12-27 DIAGNOSIS — M109 Gout, unspecified: Secondary | ICD-10-CM | POA: Diagnosis not present

## 2019-12-27 DIAGNOSIS — N529 Male erectile dysfunction, unspecified: Secondary | ICD-10-CM | POA: Diagnosis not present

## 2020-04-17 ENCOUNTER — Ambulatory Visit: Payer: Medicare Other | Admitting: Podiatry

## 2020-04-17 ENCOUNTER — Other Ambulatory Visit: Payer: Self-pay

## 2020-04-17 ENCOUNTER — Ambulatory Visit (INDEPENDENT_AMBULATORY_CARE_PROVIDER_SITE_OTHER): Payer: Medicare Other

## 2020-04-17 DIAGNOSIS — M722 Plantar fascial fibromatosis: Secondary | ICD-10-CM | POA: Diagnosis not present

## 2020-04-17 DIAGNOSIS — M79671 Pain in right foot: Secondary | ICD-10-CM | POA: Diagnosis not present

## 2020-04-17 DIAGNOSIS — L600 Ingrowing nail: Secondary | ICD-10-CM

## 2020-04-17 DIAGNOSIS — M19079 Primary osteoarthritis, unspecified ankle and foot: Secondary | ICD-10-CM | POA: Diagnosis not present

## 2020-04-17 DIAGNOSIS — M79672 Pain in left foot: Secondary | ICD-10-CM | POA: Diagnosis not present

## 2020-04-19 NOTE — Progress Notes (Signed)
Subjective:   Patient ID: David Mata, male   DOB: 75 y.o.   MRN: [id number]   HPI 75 year old male presents the office today for concerns of discomfort to both of his feet left side worse than right.  He presents broken his big toe and he has arthritis he states he started get some discomfort on his lesser toes.  The pain is intermittent.  No recent injury or trauma.  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  At times the nails become ingrown but currently no pain, redness or swelling or any drainage he reports.   Review of Systems  All other systems reviewed and are negative.  Past Medical History:  Diagnosis Date  . Arthritis   . Erectile dysfunction   . Gout   . Gout   . Hypoglycemia   . Prostate cancer [facility])     Past Surgical History:  Procedure Laterality Date  . ACHILLES TENDON REPAIR  2011   right heel   . CHOLECYSTECTOMY  12/07/10   with IOC  . ELBOW SURGERY  2008, 2010   Gout  both elbows  . FOOT SURGERY    . HAND SURGERY  2010   gout   . INSERTION PROSTATE RADIATION SEED  2004  . PROSTATE SURGERY       Current Outpatient Medications:  .  tadalafil (CIALIS) 20 MG tablet, 1/2-1 tablet, Disp: , Rfl:  .  cyclobenzaprine (FLEXERIL) 10 MG tablet, On tablet by mouth at bedtime for back pain spasm., Disp: 30 tablet, Rfl: 0 .  Febuxostat (ULORIC) 80 MG TABS, Take 80 mg by mouth daily.  , Disp: , Rfl:  .  gabapentin (NEURONTIN) 100 MG capsule, gabapentin 100 mg capsule  TAKE 1 CAPSULE BY MOUTH AT BEDTIME, Disp: , Rfl:  .  HYDROcodone-acetaminophen (NORCO/VICODIN) 5-325 MG tablet, One tablet every four hours as needed for acute pain.  Limit of five days per [place] statue., Disp: 30 tablet, Rfl: 0 .  methocarbamol (ROBAXIN) 500 MG tablet, methocarbamol 500 mg tablet  TAKE 1 TABLET TWICE A DAY BY ORAL ROUTE AS NEEDED., Disp: , Rfl:  .  naproxen (NAPROSYN) 500 MG tablet, Take 1 tablet (500 mg total) by  mouth 2 (two) times daily with a meal., Disp: 60 tablet, Rfl: 5 .  naproxen sodium (ALEVE) 220 MG tablet, 1 tablet with food or milk as needed, Disp: , Rfl:  .  predniSONE (STERAPRED UNI-PAK 21 TAB) 5 MG (21) TBPK tablet, Take 6 pills first day; 5 pills second day; 4 pills third day; 3 pills fourth day; 2 pills next day and 1 pill last day., Disp: 21 tablet, Rfl: 0  No Known Allergies       Objective:  Physical Exam  General: AAO x3, NAD  Dermatological: Mild incurvation of the nails without any edema, erythema, drainage or pus or any signs of infection.  There is no open lesions.  Vascular: Dorsalis Pedis artery and Posterior Tibial artery pedal pulses are 2/4 bilateral with immedate capillary fill time.  There is no pain with calf compression, swelling, warmth, erythema.   Neruologic: Grossly intact via light touch bilateral.   Musculoskeletal: Significant decreased range of motion of the first MPJs.  There is tenderness on the DIPJ's of the left second and third toes.  No significant pain on the MPJ range of motion or crepitation.  No other areas of pinpoint tenderness.  Muscular strength  5/5 in all groups tested bilateral.  Gait: Unassisted, Nonantalgic.       Assessment:   Arthritis; ingrown toenail     Plan:  -Treatment options discussed including all alternatives, risks, and complications -Etiology of symptoms were discussed -X-rays obtained reviewed.  Significant arthritic changes are present the first MPJ as well as the midfoot.  Arthritic changes present also most of the left second third DIPJ where he has symptoms.  There is no evidence of acute fracture. -We discussed wearing stiffer soled shoe.  We will work on try to get him size 13 graphite inserts.  Consider custom inserts if needed as well. -Discussed partial nail avulsions for ingrown toenails.  Trula Slade DPM

## 2020-04-27 ENCOUNTER — Telehealth: Payer: Self-pay | Admitting: Podiatry

## 2020-04-27 DIAGNOSIS — M19071 Primary osteoarthritis, right ankle and foot: Secondary | ICD-10-CM

## 2020-04-27 DIAGNOSIS — M19072 Primary osteoarthritis, left ankle and foot: Secondary | ICD-10-CM | POA: Diagnosis not present

## 2020-04-27 NOTE — Telephone Encounter (Signed)
Called pt per Dr Lawernce Pitts to let him know that both of the carbon stabilizer plates are in [place] and he can stop in to pick up.Marland Kitchen

## 2020-05-31 DIAGNOSIS — G5603 Carpal tunnel syndrome, bilateral upper limbs: Secondary | ICD-10-CM | POA: Diagnosis not present

## 2020-05-31 DIAGNOSIS — G629 Polyneuropathy, unspecified: Secondary | ICD-10-CM | POA: Diagnosis not present

## 2020-07-26 ENCOUNTER — Ambulatory Visit: Payer: Medicare Other | Admitting: Urology

## 2020-09-20 ENCOUNTER — Ambulatory Visit (INDEPENDENT_AMBULATORY_CARE_PROVIDER_SITE_OTHER): Payer: Medicare Other | Admitting: Urology

## 2020-09-20 ENCOUNTER — Encounter: Payer: Self-pay | Admitting: Urology

## 2020-09-20 ENCOUNTER — Other Ambulatory Visit: Payer: Self-pay

## 2020-09-20 VITALS — BP 149/79 | HR 65

## 2020-09-20 DIAGNOSIS — R350 Frequency of micturition: Secondary | ICD-10-CM | POA: Diagnosis not present

## 2020-09-20 DIAGNOSIS — N5201 Erectile dysfunction due to arterial insufficiency: Secondary | ICD-10-CM | POA: Insufficient documentation

## 2020-09-20 DIAGNOSIS — C61 Malignant neoplasm of prostate: Secondary | ICD-10-CM | POA: Insufficient documentation

## 2020-09-20 LAB — URINALYSIS, ROUTINE W REFLEX MICROSCOPIC
Bilirubin, UA: NEGATIVE
Glucose, UA: NEGATIVE
Ketones, UA: NEGATIVE
Leukocytes,UA: NEGATIVE
Nitrite, UA: NEGATIVE
Protein,UA: NEGATIVE
RBC, UA: NEGATIVE
Specific Gravity, UA: 1.025 (ref 1.005–1.030)
Urobilinogen, Ur: 0.2 mg/dL (ref 0.2–1.0)
pH, UA: 5.5 (ref 5.0–7.5)

## 2020-09-20 LAB — BLADDER SCAN AMB NON-IMAGING: Scan Result: 0

## 2020-09-20 MED ORDER — AMBULATORY NON FORMULARY MEDICATION: 0.2000 mL | 5 refills | Status: DC | PRN

## 2020-09-20 NOTE — Progress Notes (Signed)
09/20/2020 9:27 AM   David Mata 10-09-1945 [id number]  Referring provider: Gaynelle Arabian, MD [address] [place],  [place] [address]  Urinary frequency and prostate Cancer   HPI: David Mata is a 75yo here for evaluation of urinary frequency, erectile dysfunction, and prostate cancer.  PSA less than 0.1. IPSS 7 QOL 1. He is not bothered by his LUTS. His main complaint today is difficulty getting and maintaining an erection. He has tried cialis and viagra in the past. He also had FIRM-Est in the past which failed to give him a firm erection.   His records from [facility] are as follows: f/u to monitor Prostate Cancer HPI: David Mata is a 75 year-old male established patient who is here for interval evaluation of his prostate cancer.??The patient was last seen June 2019. ??He was diagnosed with prostate cancer in 07/23/2002. The patient was treated with external beam radiation. He did receive hormonal therapy around his radiation. The patient started/underwent treatemt on Sept 2004. The patient's gleason score was: 7. ??The patient's most recent PSA was 0.043. ??He received a total of 6 months months of ADT with his radiation. ??He does not have biochemical recurrence of his prostate cancer. ??He does have problems with erections. Currently he is using Cialis 20 mg tablets for his ED. His erections are not satisfactory for intercouse. ??The patient denies having urinary incontinence. The patient denies any new bone pain, new back pain, or lower extremity edema. The patient has developed frequency. ??Treated with EBRT and brachy-boost. At one point had concern for recurrence but biopsy was negative. PSA has been stable. ??The patient also has been having urinary frequency. This is not new. He's been having this since the radiation 15 years ago. He denies any leakage. He gets up several times at night. ??The patient described gross hematuria intermittently at his last visit 1 year ago but  declined any additional evaluation. He also wanted to do the shockwave therapy for his erectile dysfunction but did not schedule follow-up. ??Interval: Patient has not had any hematuria. His urinary frequency stable. Denies any dysuria or any significant lower urinary tract symptoms. Continues to struggle with erectile dysfunction, and is still curious about shockwave therapy. He has not been able to tolerate sildenafil in the past because of side effects. Cialis is no longer effective. ?    PMH: Past Medical History:  Diagnosis Date   Acid reflux    Arthritis    Erectile dysfunction    Gout    Gout    Hepatitis    Hypoglycemia    Prostate cancer ([place])    Prostate cancer [facility])     Surgical History: Past Surgical History:  Procedure Laterality Date   ACHILLES TENDON REPAIR  2011   right heel    CHOLECYSTECTOMY  12/07/10   with [place]  2008, 2010   Gout  both elbows   FOOT SURGERY     HAND SURGERY  2010   gout    INSERTION PROSTATE RADIATION SEED  2004   PROSTATE SURGERY      Home Medications:  Allergies as of 09/20/2020   No Known Allergies      Medication List        Accurate as of September 20, 2020  9:27 AM. If you have any questions, ask your nurse or doctor.          cyclobenzaprine 10 MG tablet Commonly known as: FLEXERIL On tablet by mouth  at bedtime for back pain spasm.   Febuxostat 80 MG Tabs Take 80 mg by mouth daily.   gabapentin 100 MG capsule Commonly known as: NEURONTIN gabapentin 100 mg capsule  TAKE 1 CAPSULE BY MOUTH AT BEDTIME   HYDROcodone-acetaminophen 5-325 MG tablet Commonly known as: NORCO/VICODIN One tablet every four hours as needed for acute pain.  Limit of five days per [place].   methocarbamol 500 MG tablet Commonly known as: ROBAXIN methocarbamol 500 mg tablet  TAKE 1 TABLET TWICE A DAY BY ORAL ROUTE AS NEEDED.   naproxen 500 MG tablet Commonly known as: NAPROSYN Take 1 tablet (500 mg total) by mouth  2 (two) times daily with a meal.   naproxen sodium 220 MG tablet Commonly known as: ALEVE 1 tablet with food or milk as needed   predniSONE 5 MG (21) Tbpk tablet Commonly known as: STERAPRED UNI-PAK 21 TAB Take 6 pills first day; 5 pills second day; 4 pills third day; 3 pills fourth day; 2 pills next day and 1 pill last day.   tadalafil 20 MG tablet Commonly known as: CIALIS 1/2-1 tablet        Allergies: No Known Allergies  Family History: Family History  Problem Relation Age of Onset   Heart failure Mother    Heart failure Father    Heart attack Father    Cancer Brother    Cancer Paternal Grandfather    Heart attack Paternal Grandfather    Diabetes Maternal Grandfather     Social History:  reports that he has quit smoking. His smoking use included cigars. He has never used smokeless tobacco. He reports current alcohol use. He reports that he does not use drugs.  ROS: All other review of systems were reviewed and are negative except what is noted above in HPI  Physical Exam: BP (!) 149/79   Pulse 65   Constitutional:  Alert and oriented, No acute distress. HEENT:  AT, moist mucus membranes.  Trachea midline, no masses. Cardiovascular: No clubbing, cyanosis, or edema. Respiratory: Normal respiratory effort, no increased work of breathing. GI: Abdomen is soft, nontender, nondistended, no abdominal masses GU: No CVA tenderness.   Lymph: No cervical or inguinal lymphadenopathy. Skin: No rashes, bruises or suspicious lesions. Neurologic: Grossly intact, no focal deficits, moving all 4 extremities. Psychiatric: Normal mood and affect.  Laboratory Data: Lab Results  Component Value Date   HGB 17.6 (H) 01/04/2009    No results found for: CREATININE  No results found for: PSA  No results found for: TESTOSTERONE  No results found for: HGBA1C  Urinalysis No results found for: COLORURINE, APPEARANCEUR, LABSPEC, PHURINE, GLUCOSEU, HGBUR, BILIRUBINUR, KETONESUR,  PROTEINUR, UROBILINOGEN, NITRITE, LEUKOCYTESUR  No results found for: LABMICR, [place], RBCUA, LABEPIT, MUCUS, BACTERIA  Pertinent Imaging:  No results found for this or any previous visit.  No results found for this or any previous visit.  No results found for this or any previous visit.  No results found for this or any previous visit.  No results found for this or any previous visit.  No results found for this or any previous visit.  No results found for this or any previous visit.  No results found for this or any previous visit.   Assessment & Plan:    1. Urinary frequency -We will proceed with observation since he is not bothered by his LUTS - BLADDER SCAN AMB NON-IMAGING - Urinalysis, Routine w reflex microscopic  2. Prostate cancer [facility]) -Patient to continue yearly PSA.  3. Erectile dysfunction due to arterial insufficiency -We will trial trimix.   No follow-ups on file.  Nicolette Bang, MD  [facility] [place]

## 2020-09-20 NOTE — Patient Instructions (Signed)
Erectile Dysfunction Erectile dysfunction (ED) is the inability to get or keep an erection in order to have sexual intercourse. ED is considered a symptom of an underlying disorder and not considered a disease. Erectile dysfunction may include: Inability to get an erection. Lack of enough hardness of the erection to allow penetration. Loss of the erection before sex is finished. What are the causes? This condition may be caused by: Certain medicines, such as: Pain relievers. Antihistamines. Antidepressants. Blood pressure medicines. Water pills (diuretics). Ulcer medicines. Muscle relaxants. Drugs. Excessive drinking. Psychological causes, such as: Anxiety. Depression. Sadness. Exhaustion. Performance fear. Stress. Physical causes, such as: Artery problems. This may include diabetes, smoking, liver disease, or atherosclerosis. High blood pressure. Hormonal problems, such as low testosterone. Obesity. Nerve problems. This may include back or pelvic injuries, diabetes mellitus, multiple sclerosis, or Parkinson's disease. What are the signs or symptoms? Symptoms of this condition include: Inability to get an erection. Lack of enough hardness of the erection to allow penetration. Loss of the erection before sex is finished. Normal erections at some times, but with frequent unsatisfactory episodes. Low sexual satisfaction in either partner due to erection problems. A curved penis occurring with erection. The curve may cause pain or the penis may be too curved to allow for intercourse. Never having nighttime erections. How is this diagnosed? This condition is often diagnosed by: Performing a physical exam to find other diseases or specific problems with the penis. Asking you detailed questions about the problem. Performing blood tests to check for diabetes mellitus or to measure hormone levels. Performing other tests to check for underlying health conditions. Performing an  ultrasound exam to check for scarring. Performing a test to check blood flow to the penis. Doing a sleep study at home to measure nighttime erections. How is this treated? This condition may be treated by: Medicine taken by mouth to help you achieve an erection (oral medicine). Hormone replacement therapy to replace low testosterone levels. Medicine that is injected into the penis. Your health care provider may instruct you how to give yourself these injections at home. Vacuum pump. This is a pump with a ring on it. The pump and ring are placed on the penis and used to create pressure that helps the penis become erect. Penile implant surgery. In this procedure, you may receive: An inflatable implant. This consists of cylinders, a pump, and a reservoir. The cylinders can be inflated with a fluid that helps to create an erection, and they can be deflated after intercourse. A semi-rigid implant. This consists of two silicone rubber rods. The rods provide some rigidity. They are also flexible, so the penis can both curve downward in its normal position and become straight for sexual intercourse. Blood vessel surgery, to improve blood flow to the penis. During this procedure, a blood vessel from a different part of the body is placed into the penis to allow blood to flow around (bypass) damaged or blocked blood vessels. Lifestyle changes, such as exercising more, losing weight, and quitting smoking. Follow these instructions at home: Medicines  Take over-the-counter and prescription medicines only as told by your health care provider. Do not increase the dosage without first discussing it with your health care provider. If you are using self-injections, perform injections as directed by your health care provider. Make sure to avoid any veins that are on the surface of the penis. After giving an injection, apply pressure to the injection site for 5 minutes.  General instructions Exercise regularly, as  directed by your health care provider. Work with your health care provider to lose weight, if needed. Do not use any products that contain nicotine or tobacco, such as cigarettes and e-cigarettes. If you need help quitting, ask your health care provider. Before using a vacuum pump, read the instructions that come with the pump and discuss any questions with your health care provider. Keep all follow-up visits as told by your health care provider. This is important. Contact a health care provider if: You feel nauseous. You vomit. Get help right away if: You are taking oral or injectable medicines and you have an erection that lasts longer than 4 hours. If your health care provider is unavailable, go to the nearest emergency room for evaluation. An erection that lasts much longer than 4 hours can result in permanent damage to your penis. You have severe pain in your groin or abdomen. You develop redness or severe swelling of your penis. You have redness spreading up into your groin or lower abdomen. You are unable to urinate. You experience chest pain or a rapid heart beat (palpitations) after taking oral medicines. Summary Erectile dysfunction (ED) is the inability to get or keep an erection during sexual intercourse. This problem can usually be treated successfully. This condition is diagnosed based on a physical exam, your symptoms, and tests to determine the cause. Treatment varies depending on the cause and may include medicines, hormone therapy, surgery, or a vacuum pump. You may need follow-up visits to make sure that you are using your medicines or devices correctly. Get help right away if you are taking or injecting medicines and you have an erection that lasts longer than 4 hours. This information is not intended to replace advice given to you by your health care provider. Make sure you discuss any questions you have with your healthcare provider. Document Revised: 01/04/2020 Document  Reviewed: 10/01/2019 Elsevier Patient Education  [place].

## 2020-09-20 NOTE — Addendum Note (Signed)
Addended byIris Pert on: 09/20/2020 10:03 AM   Modules accepted: Orders

## 2020-09-20 NOTE — Progress Notes (Signed)
post void residual=0  Urological Symptom Review  Patient is experiencing the following symptoms: Frequent urination Hard to postpone urination Get up at night to urinate Leakage of urine Erection problems (male only)   Review of Systems  Gastrointestinal (upper)  : Negative for upper GI symptoms  Gastrointestinal (lower) : Negative for lower GI symptoms  Constitutional : Negative for symptoms  Skin: Negative for skin symptoms  Eyes: Negative for eye symptoms  Ear/Nose/Throat : Negative for Ear/Nose/Throat symptoms  Hematologic/Lymphatic: Negative for Hematologic/Lymphatic symptoms  Cardiovascular : Negative for cardiovascular symptoms  Respiratory : Negative for respiratory symptoms  Endocrine: Negative for endocrine symptoms  Musculoskeletal: Back pain Joint pain  Neurological: Negative for neurological symptoms  Psychologic: Negative for psychiatric symptoms

## 2020-10-04 ENCOUNTER — Other Ambulatory Visit: Payer: Self-pay

## 2020-10-04 ENCOUNTER — Ambulatory Visit: Payer: Medicare Other | Admitting: Urology

## 2020-10-04 VITALS — BP 161/79 | HR 64

## 2020-10-04 DIAGNOSIS — N5201 Erectile dysfunction due to arterial insufficiency: Secondary | ICD-10-CM | POA: Diagnosis not present

## 2020-10-04 NOTE — Progress Notes (Signed)
10/04/2020 8:51 AM   David Mata 1945-08-14 [id number]  Referring provider: Gaynelle Arabian, MD [address] [place],  [place] [address]  Erectile dysfunction   HPI: [name] is a 75yo here for trimix teaching.    PMH: Past Medical History:  Diagnosis Date   Acid reflux    Arthritis    Erectile dysfunction    Gout    Gout    Hepatitis    Hypoglycemia    Prostate cancer [facility])    Prostate cancer [facility])     Surgical History: Past Surgical History:  Procedure Laterality Date   ACHILLES TENDON REPAIR  2011   right heel    CHOLECYSTECTOMY  12/07/10   with [place]  2008, 2010   Gout  both elbows   FOOT SURGERY     HAND SURGERY  2010   gout    INSERTION PROSTATE RADIATION SEED  2004   PROSTATE SURGERY      Home Medications:  Allergies as of 10/04/2020   No Known Allergies      Medication List        Accurate as of October 04, 2020  8:51 AM. If you have any questions, ask your nurse or doctor.          AMBULATORY NON FORMULARY MEDICATION 0.2 mLs by Intracavernosal route as needed. Medication Name: Trimix  PGE 53mg Pap '30mg'$  Phent '1mg'$    AMBULATORY NON FORMULARY MEDICATION 0.2 mLs by Intracavernosal route as needed. Medication Name: Trimix  PGE 30 mcg Pap 30 mg Phent '1mg'$  0.2  Mls by intracavernosal route as needed   cyclobenzaprine 10 MG tablet Commonly known as: FLEXERIL On tablet by mouth at bedtime for back pain spasm.   Febuxostat 80 MG Tabs Take 80 mg by mouth daily.   gabapentin 100 MG capsule Commonly known as: NEURONTIN gabapentin 100 mg capsule  TAKE 1 CAPSULE BY MOUTH AT BEDTIME   HYDROcodone-acetaminophen 5-325 MG tablet Commonly known as: NORCO/VICODIN One tablet every four hours as needed for acute pain.  Limit of five days per [place].   methocarbamol 500 MG tablet Commonly known as: ROBAXIN methocarbamol 500 mg tablet  TAKE 1 TABLET TWICE A DAY BY ORAL ROUTE AS NEEDED.   naproxen  500 MG tablet Commonly known as: NAPROSYN Take 1 tablet (500 mg total) by mouth 2 (two) times daily with a meal.   naproxen sodium 220 MG tablet Commonly known as: ALEVE 1 tablet with food or milk as needed   predniSONE 5 MG (21) Tbpk tablet Commonly known as: STERAPRED UNI-PAK 21 TAB Take 6 pills first day; 5 pills second day; 4 pills third day; 3 pills fourth day; 2 pills next day and 1 pill last day.   tadalafil 20 MG tablet Commonly known as: CIALIS 1/2-1 tablet        Allergies: No Known Allergies  Family History: Family History  Problem Relation Age of Onset   Heart failure Mother    Heart failure Father    Heart attack Father    Cancer Brother    Cancer Paternal Grandfather    Heart attack Paternal Grandfather    Diabetes Maternal Grandfather     Social History:  reports that he has quit smoking. His smoking use included cigars. He has never used smokeless tobacco. He reports current alcohol use. He reports that he does not use drugs.  ROS: All other review of systems were reviewed and are negative  except what is noted above in HPI  Physical Exam: BP (!) 161/79   Pulse 64   Constitutional:  Alert and oriented, No acute distress. HEENT: [place] AT, moist mucus membranes.  Trachea midline, no masses. Cardiovascular: No clubbing, cyanosis, or edema. Respiratory: Normal respiratory effort, no increased work of breathing. GI: Abdomen is soft, nontender, nondistended, no abdominal masses GU: No CVA tenderness.  Lymph: No cervical or inguinal lymphadenopathy. Skin: No rashes, bruises or suspicious lesions. Neurologic: Grossly intact, no focal deficits, moving all 4 extremities. Psychiatric: Normal mood and affect.  Laboratory Data: Lab Results  Component Value Date   HGB 17.6 (H) 01/04/2009    No results found for: CREATININE  No results found for: PSA  No results found for: TESTOSTERONE  No results found for: HGBA1C  Urinalysis    Component Value  Date/Time   APPEARANCEUR Clear 09/20/2020 0839   GLUCOSEU Negative 09/20/2020 0839   BILIRUBINUR Negative 09/20/2020 0839   PROTEINUR Negative 09/20/2020 0839   NITRITE Negative 09/20/2020 0839   LEUKOCYTESUR Negative 09/20/2020 0839    Lab Results  Component Value Date   LABMICR Comment 09/20/2020    Pertinent Imaging:  No results found for this or any previous visit.  No results found for this or any previous visit.  No results found for this or any previous visit.  No results found for this or any previous visit.  No results found for this or any previous visit.  No results found for this or any previous visit.  No results found for this or any previous visit.  No results found for this or any previous visit.   Assessment & Plan:    Trimix injection instruction:  Patient instructed to draw 0.56m of trimix into the insulin syringe. I them instructed him to inject at the mid penile shaft at either the 3 or 9 o'clock position. I then injected the patient and he achieved a good erection in 20 minutes. Penile injection completed.   1. Erectile dysfunction due to arterial insufficiency The patient was instructed on proper technique for trimix injection. He is instructed to alternate side and location of the injection. He was instructed in titrating the trimix dose. He is instructed to call the office for an erection lasting more than 4 hours    No follow-ups on file.  PNicolette Bang MD  [facility] [place]

## 2020-10-04 NOTE — Progress Notes (Signed)
Urological Symptom Review  Patient is experiencing the following symptoms: Frequent urination Hard to postpone urination Get up at night to urinate Leakage of urine Erection problems (male only)   Review of Systems  Gastrointestinal (upper)  : Negative for upper GI symptoms  Gastrointestinal (lower) : Negative for lower GI symptoms  Constitutional : Negative for symptoms  Skin: Negative for skin symptoms  Eyes: Negative for eye symptoms  Ear/Nose/Throat : Negative for Ear/Nose/Throat symptoms  Hematologic/Lymphatic: Negative for Hematologic/Lymphatic symptoms  Cardiovascular : Negative for cardiovascular symptoms  Respiratory : Negative for respiratory symptoms  Endocrine: Negative for endocrine symptoms  Musculoskeletal: Negative for musculoskeletal symptoms  Neurological: Negative for neurological symptoms  Psychologic: Negative for psychiatric symptoms

## 2020-10-19 ENCOUNTER — Encounter: Payer: Self-pay | Admitting: Urology

## 2020-10-19 NOTE — Patient Instructions (Signed)
Erectile Dysfunction °Erectile dysfunction (ED) is the inability to get or keep an erection in order to have sexual intercourse. ED is considered a symptom of an underlying disorder and is not considered a disease. ED may include: °Inability to get an erection. °Lack of enough hardness of the erection to allow penetration. °Loss of erection before sex is finished. °What are the causes? °This condition may be caused by: °Physical causes, such as: °Artery problems. This may include heart disease, high blood pressure, atherosclerosis, and diabetes. °Hormonal problems, such as low testosterone. °Obesity. °Nerve problems. This may include back or pelvic injuries, multiple sclerosis, Parkinson's disease, spinal cord injury, and stroke. °Certain medicines, such as: °Pain relievers. °Antidepressants. °Blood pressure medicines and water pills (diuretics). °Cancer medicines. °Antihistamines. °Muscle relaxants. °Lifestyle factors, such as: °Use of drugs such as marijuana, cocaine, or opioids. °Excessive use of alcohol. °Smoking. °Lack of physical activity or exercise. °Psychological causes, such as: °Anxiety or stress. °Sadness or depression. °Exhaustion. °Fear about sexual performance. °Guilt. °What are the signs or symptoms? °Symptoms of this condition include: °Inability to get an erection. °Lack of enough hardness of the erection to allow penetration. °Loss of the erection before sex is finished. °Sometimes having normal erections, but with frequent unsatisfactory episodes. °Low sexual satisfaction in either partner due to erection problems. °A curved penis occurring with erection. The curve may cause pain, or the penis may be too curved to allow for intercourse. °Never having nighttime or morning erections. °How is this diagnosed? °This condition is often diagnosed by: °Performing a physical exam to find other diseases or specific problems with the penis. °Asking you detailed questions about the problem. °Doing tests,  such as: °Blood tests to check for diabetes mellitus or high cholesterol, or to measure hormone levels. °Other tests to check for underlying health conditions. °An ultrasound exam to check for scarring. °A test to check blood flow to the penis. °Doing a sleep study at home to measure nighttime erections. °How is this treated? °This condition may be treated by: °Medicines, such as: °Medicine taken by mouth to help you achieve an erection (oral medicine). °Hormone replacement therapy to replace low testosterone levels. °Medicine that is injected into the penis. Your health care provider may instruct you how to give yourself these injections at home. °Medicine that is delivered with a short applicator tube. The tube is inserted into the opening at the tip of the penis, which is the opening of the urethra. A tiny pellet of medicine is put in the urethra. The pellet dissolves and enhances erectile function. This is also called MUSE (medicated urethral system for erections) therapy. °Vacuum pump. This is a pump with a ring on it. The pump and ring are placed on the penis and used to create pressure that helps the penis become erect. °Penile implant surgery. In this procedure, you may receive: °An inflatable implant. This consists of cylinders, a pump, and a reservoir. The cylinders can be inflated with a fluid that helps to create an erection, and they can be deflated after intercourse. °A semi-rigid implant. This consists of two silicone rubber rods. The rods provide some rigidity. They are also flexible, so the penis can both curve downward in its normal position and become straight for sexual intercourse. °Blood vessel surgery to improve blood flow to the penis. During this procedure, a blood vessel from a different part of the body is placed into the penis to allow blood to flow around (bypass) damaged or blocked blood vessels. °Lifestyle changes,   such as exercising more, losing weight, and quitting smoking. °Follow  these instructions at home: °Medicines ° °Take over-the-counter and prescription medicines only as told by your health care provider. Do not increase the dosage without first discussing it with your health care provider. °If you are using self-injections, do injections as directed by your health care provider. Make sure you avoid any veins that are on the surface of the penis. After giving an injection, apply pressure to the injection site for 5 minutes. °Talk to your health care provider about how to prevent headaches while taking ED medicines. These medicines may cause a sudden headache due to the increase in blood flow in your body. °General instructions °Exercise regularly, as directed by your health care provider. Work with your health care provider to lose weight, if needed. °Do not use any products that contain nicotine or tobacco. These products include cigarettes, chewing tobacco, and vaping devices, such as e-cigarettes. If you need help quitting, ask your health care provider. °Before using a vacuum pump, read the instructions that come with the pump and discuss any questions with your health care provider. °Keep all follow-up visits. This is important. °Contact a health care provider if: °You feel nauseous. °You are vomiting. °You get sudden headaches while taking ED medicines. °You have any concerns about your sexual health. °Get help right away if: °You are taking oral or injectable medicines and you have an erection that lasts longer than 4 hours. If your health care provider is unavailable, go to the nearest emergency room for evaluation. An erection that lasts much longer than 4 hours can result in permanent damage to your penis. °You have severe pain in your groin or abdomen. °You develop redness or severe swelling of your penis. °You have redness spreading at your groin or lower abdomen. °You are unable to urinate. °You experience chest pain or a rapid heartbeat (palpitations) after taking oral  medicines. °These symptoms may represent a serious problem that is an emergency. Do not wait to see if the symptoms will go away. Get medical help right away. Call your local emergency services (911 in the [place] Do not drive yourself to the hospital. °Summary °Erectile dysfunction (ED) is the inability to get or keep an erection during sexual intercourse. °This condition is diagnosed based on a physical exam, your symptoms, and tests to determine the cause. Treatment varies depending on the cause and may include medicines, hormone therapy, surgery, or a vacuum pump. °You may need follow-up visits to make sure that you are using your medicines or devices correctly. °Get help right away if you are taking or injecting medicines and you have an erection that lasts longer than 4 hours. °This information is not intended to replace advice given to you by your health care provider. Make sure you discuss any questions you have with your health care provider. °Document Revised: 04/12/2020 Document Reviewed: 04/12/2020 °Elsevier Patient Education © 2022 [employer] ° °

## 2021-01-01 DIAGNOSIS — Z0000 Encounter for general adult medical examination without abnormal findings: Secondary | ICD-10-CM | POA: Diagnosis not present

## 2021-01-01 DIAGNOSIS — Z79899 Other long term (current) drug therapy: Secondary | ICD-10-CM | POA: Diagnosis not present

## 2021-01-01 DIAGNOSIS — N529 Male erectile dysfunction, unspecified: Secondary | ICD-10-CM | POA: Diagnosis not present

## 2021-01-01 DIAGNOSIS — M109 Gout, unspecified: Secondary | ICD-10-CM | POA: Diagnosis not present

## 2021-01-01 DIAGNOSIS — Z8546 Personal history of malignant neoplasm of prostate: Secondary | ICD-10-CM | POA: Diagnosis not present

## 2021-01-18 ENCOUNTER — Other Ambulatory Visit: Payer: Self-pay

## 2021-01-18 ENCOUNTER — Encounter: Payer: Self-pay | Admitting: Orthopaedic Surgery

## 2021-01-18 ENCOUNTER — Ambulatory Visit: Payer: Medicare Other

## 2021-01-18 ENCOUNTER — Ambulatory Visit (INDEPENDENT_AMBULATORY_CARE_PROVIDER_SITE_OTHER): Payer: Medicare Other | Admitting: Orthopaedic Surgery

## 2021-01-18 VITALS — BP 156/88 | HR 65 | Ht 72.0 in | Wt 256.4 lb

## 2021-01-18 DIAGNOSIS — G8929 Other chronic pain: Secondary | ICD-10-CM

## 2021-01-18 DIAGNOSIS — M25562 Pain in left knee: Secondary | ICD-10-CM

## 2021-01-18 DIAGNOSIS — M25572 Pain in left ankle and joints of left foot: Secondary | ICD-10-CM | POA: Diagnosis not present

## 2021-01-18 DIAGNOSIS — S86012A Strain of left Achilles tendon, initial encounter: Secondary | ICD-10-CM

## 2021-01-18 MED ORDER — NAPROXEN 500 MG PO TABS: 500.0000 mg | ORAL_TABLET | Freq: Two times a day (BID) | ORAL | 5 refills | Status: DC

## 2021-01-18 NOTE — Addendum Note (Signed)
Addended by: Derek Mound A on: 01/18/2021 02:01 PM   Modules accepted: Orders

## 2021-01-18 NOTE — Progress Notes (Signed)
° °  Subjective:    Patient ID: David Mata, male    DOB: 1945/07/30, 75 y.o.   MRN: [id number]  HPI He has pain of the left ankle and posterior left knee.  He has more pain in the ankle at times.  He limps.  He has swelling.  He has been seen by podiatry for the foot.  He has no trauma.  His knee swells at time but does not give way.  He has no redness or numbness.  He does have gout but is not taking anything for it.  Aleve helps his pain.   Review of Systems  Constitutional:  Positive for activity change.  Musculoskeletal:  Positive for arthralgias, gait problem and joint swelling.  All other systems reviewed and are negative.     Objective:   Physical Exam Vitals and nursing note reviewed. Exam conducted with a chaperone present.  Constitutional:      Appearance: He is well-developed.  HENT:     Head: Normocephalic and atraumatic.  Eyes:     Conjunctiva/sclera: Conjunctivae normal.     Pupils: Pupils are equal, round, and reactive to light.  Cardiovascular:     Rate and Rhythm: Normal rate and regular rhythm.  Pulmonary:     Effort: Pulmonary effort is normal.  Abdominal:     Palpations: Abdomen is soft.  Musculoskeletal:     Cervical back: Normal range of motion and neck supple.       Legs:  Skin:    General: Skin is warm and dry.  Neurological:     Mental Status: He is alert and oriented to person, place, and time.     Cranial Nerves: No cranial nerve deficit.     Motor: No abnormal muscle tone.     Coordination: Coordination normal.     Deep Tendon Reflexes: Reflexes are normal and symmetric. Reflexes normal.  Psychiatric:        Behavior: Behavior normal.        Thought Content: Thought content normal.        Judgment: Judgment normal.  X-rays were done of the left knee and left ankle, reported separately.        Assessment & Plan:   Encounter Diagnoses  Name Primary?   Chronic pain of left knee Yes   Chronic pain of left ankle    Partial Achilles  tendon tear, left, initial encounter    I am concerned about Achilles tear.  I will get MRI.  I will begin Naprosyn 500 po bid pc.  He declines to take any medicine for gout.  Return in one month.  Call if any problem.  Precautions discussed.  Electronically Signed Sanjuana Kava, MD 12/22/20229:56 AM

## 2021-02-15 ENCOUNTER — Other Ambulatory Visit: Payer: Self-pay

## 2021-02-15 ENCOUNTER — Ambulatory Visit: Payer: Medicare Other | Admitting: Orthopaedic Surgery

## 2021-04-04 ENCOUNTER — Ambulatory Visit: Payer: Medicare Other | Admitting: Urology

## 2021-04-09 ENCOUNTER — Ambulatory Visit: Payer: Medicare Other | Admitting: Urology

## 2021-04-09 ENCOUNTER — Encounter: Payer: Self-pay | Admitting: Urology

## 2021-04-09 ENCOUNTER — Other Ambulatory Visit: Payer: Self-pay

## 2021-04-09 VITALS — BP 168/90 | HR 69 | Ht 73.0 in | Wt 240.0 lb

## 2021-04-09 DIAGNOSIS — C61 Malignant neoplasm of prostate: Secondary | ICD-10-CM | POA: Diagnosis not present

## 2021-04-09 DIAGNOSIS — N5201 Erectile dysfunction due to arterial insufficiency: Secondary | ICD-10-CM | POA: Diagnosis not present

## 2021-04-09 DIAGNOSIS — R350 Frequency of micturition: Secondary | ICD-10-CM | POA: Diagnosis not present

## 2021-04-09 LAB — URINALYSIS, ROUTINE W REFLEX MICROSCOPIC
Bilirubin, UA: NEGATIVE
Glucose, UA: NEGATIVE
Ketones, UA: NEGATIVE
Leukocytes,UA: NEGATIVE
Nitrite, UA: NEGATIVE
Protein,UA: NEGATIVE
RBC, UA: NEGATIVE
Specific Gravity, UA: 1.015 (ref 1.005–1.030)
Urobilinogen, Ur: 0.2 mg/dL (ref 0.2–1.0)
pH, UA: 5.5 (ref 5.0–7.5)

## 2021-04-09 MED ORDER — MIRABEGRON ER 25 MG PO TB24: 25.0000 mg | ORAL_TABLET | Freq: Every day | ORAL | 0 refills | Status: DC

## 2021-04-09 NOTE — Patient Instructions (Signed)

## 2021-04-09 NOTE — Progress Notes (Signed)
? ?04/09/2021 ?9:30 AM  ? ?David Mata ?Mar 30, 1945 ?[id number] ? ?Referring provider: Gaynelle Arabian, MD ?[address] ?[address] ?[place],  [place] [address] ? ?Followup erectile dysfunction and prostate cancer ? ? ?HPI: ?David Mata is a 76yo here for followup for erectile dysfunction and prostate cancer. PSA was 0.1. IPSS 5 QOL 4. He issues with urinary frequency every 30-60 minutes. Nocturia 2x. He has mild SUI. He has tried trimix injections which caused bruising but failed to give him a firm erection.  ? ? ?PMH: ?Past Medical History:  ?Diagnosis Date  ? Acid reflux   ? Arthritis   ? Erectile dysfunction   ? Gout   ? Gout   ? Hepatitis   ? Hypoglycemia   ? Prostate cancer ([place])   ? Prostate cancer ([place])   ? ? ?Surgical History: ?Past Surgical History:  ?Procedure Laterality Date  ? ACHILLES TENDON REPAIR  2011  ? right heel   ? CHOLECYSTECTOMY  12/07/10  ? with IOC  ? ELBOW SURGERY  2008, 2010  ? Gout  both elbows  ? FOOT SURGERY    ? HAND SURGERY  2010  ? gout   ? INSERTION PROSTATE RADIATION SEED  2004  ? PROSTATE SURGERY    ? ? ?Home Medications:  ?Allergies as of 04/09/2021   ?No Known Allergies ?  ? ?  ?Medication List  ?  ? ?  ? Accurate as of April 09, 2021  9:30 AM. If you have any questions, ask your nurse or doctor.  ?  ?  ? ?  ? ?STOP taking these medications   ? ?Febuxostat 80 MG Tabs ?Stopped by: Nicolette Bang, MD ?  ?tadalafil 20 MG tablet ?Commonly known as: CIALIS ?Stopped by: Nicolette Bang, MD ?  ? ?  ? ?TAKE these medications   ? ?AMBULATORY NON FORMULARY MEDICATION ?0.2 mLs by Intracavernosal route as needed. Medication Name: Trimix ? ?PGE 64mg ?Pap '30mg'$  ?Phent '1mg'$  ?  ?AMBULATORY NON FORMULARY MEDICATION ?0.2 mLs by Intracavernosal route as needed. Medication Name: Trimix  PGE 30 mcg Pap 30 mg Phent '1mg'$  ?0.2  Mls by intracavernosal route as needed ?  ?HYDROcodone-acetaminophen 5-325 MG tablet ?Commonly known as: NORCO/VICODIN ?One tablet every four hours as needed for acute pain.   Limit of five days per [place]. ?  ?naproxen 500 MG tablet ?Commonly known as: NAPROSYN ?Take 1 tablet (500 mg total) by mouth 2 (two) times daily with a meal. ?  ? ?  ? ? ?Allergies: No Known Allergies ? ?Family History: ?Family History  ?Problem Relation Age of Onset  ? Heart failure Mother   ? Heart failure Father   ? Heart attack Father   ? Cancer Brother   ? Cancer Paternal Grandfather   ? Heart attack Paternal Grandfather   ? Diabetes Maternal Grandfather   ? ? ?Social History:  reports that he has quit smoking. His smoking use included cigars. He has never used smokeless tobacco. He reports current alcohol use. He reports that he does not use drugs. ? ?ROS: ?All other review of systems were reviewed and are negative except what is noted above in HPI ? ?Physical Exam: ?BP (!) 168/90   Pulse 69   Ht '6\' 1"'$  (1.854 m)   Wt 240 lb (108.9 kg)   BMI 31.66 kg/m?   ?Constitutional:  Alert and oriented, No acute distress. ?HEENT:  AT, moist mucus membranes.  Trachea midline, no masses. ?Cardiovascular: No clubbing, cyanosis, or edema. ?  Respiratory: Normal respiratory effort, no increased work of breathing. ?GI: Abdomen is soft, nontender, nondistended, no abdominal masses ?GU: No CVA tenderness.  ?Lymph: No cervical or inguinal lymphadenopathy. ?Skin: No rashes, bruises or suspicious lesions. ?Neurologic: Grossly intact, no focal deficits, moving all 4 extremities. ?Psychiatric: Normal mood and affect. ? ?Laboratory Data: ?Lab Results  ?Component Value Date  ? HGB 17.6 (H) 01/04/2009  ? ? ?No results found for: CREATININE ? ?No results found for: PSA ? ?No results found for: TESTOSTERONE ? ?No results found for: HGBA1C ? ?Urinalysis ?   ?Component Value Date/Time  ? APPEARANCEUR Clear 09/20/2020 0839  ? GLUCOSEU Negative 09/20/2020 0839  ? BILIRUBINUR Negative 09/20/2020 0839  ? PROTEINUR Negative 09/20/2020 0839  ? NITRITE Negative 09/20/2020 0839  ? LEUKOCYTESUR Negative 09/20/2020 0839  ? ? ?Lab Results   ?Component Value Date  ? LABMICR Comment 09/20/2020  ? ? ?Pertinent Imaging: ? ?No results found for this or any previous visit. ? ?No results found for this or any previous visit. ? ?No results found for this or any previous visit. ? ?No results found for this or any previous visit. ? ?No results found for this or any previous visit. ? ?No results found for this or any previous visit. ? ?No results found for this or any previous visit. ? ?No results found for this or any previous visit. ? ? ?Assessment & Plan:   ? ?1. Urinary frequency ?-we will trial mirabegron '25mg'$   ?- Urinalysis, Routine w reflex microscopic ? ?2. Erectile dysfunction due to arterial insufficiency ?-patient defers therapy at this time ? ?3. Prostate cancer ([place]) ?-RTC 1 year with PSA ? ? ?No follow-ups on file. ? ?Nicolette Bang, MD ? ?[facility] ?  ?

## 2021-08-01 DIAGNOSIS — K649 Unspecified hemorrhoids: Secondary | ICD-10-CM | POA: Diagnosis not present

## 2021-08-01 DIAGNOSIS — K5520 Angiodysplasia of colon without hemorrhage: Secondary | ICD-10-CM | POA: Diagnosis not present

## 2021-08-01 DIAGNOSIS — D123 Benign neoplasm of transverse colon: Secondary | ICD-10-CM | POA: Diagnosis not present

## 2021-08-01 DIAGNOSIS — K5730 Diverticulosis of large intestine without perforation or abscess without bleeding: Secondary | ICD-10-CM | POA: Diagnosis not present

## 2021-08-01 DIAGNOSIS — Z86010 Personal history of colonic polyps: Secondary | ICD-10-CM | POA: Diagnosis not present

## 2021-08-06 DIAGNOSIS — D123 Benign neoplasm of transverse colon: Secondary | ICD-10-CM | POA: Diagnosis not present

## 2021-08-10 ENCOUNTER — Telehealth: Payer: Self-pay | Admitting: Orthopaedic Surgery

## 2021-10-12 ENCOUNTER — Ambulatory Visit: Payer: Medicare Other | Admitting: Urology

## 2021-10-29 ENCOUNTER — Encounter (HOSPITAL_BASED_OUTPATIENT_CLINIC_OR_DEPARTMENT_OTHER): Payer: Self-pay | Admitting: Orthopedic Surgery

## 2021-10-29 ENCOUNTER — Other Ambulatory Visit: Payer: Self-pay | Admitting: Orthopedic Surgery

## 2021-10-29 DIAGNOSIS — M79642 Pain in left hand: Secondary | ICD-10-CM | POA: Diagnosis not present

## 2021-10-29 DIAGNOSIS — M79641 Pain in right hand: Secondary | ICD-10-CM | POA: Diagnosis not present

## 2021-10-29 DIAGNOSIS — G5602 Carpal tunnel syndrome, left upper limb: Secondary | ICD-10-CM | POA: Diagnosis not present

## 2021-10-29 DIAGNOSIS — M1812 Unilateral primary osteoarthritis of first carpometacarpal joint, left hand: Secondary | ICD-10-CM | POA: Diagnosis not present

## 2021-11-02 DIAGNOSIS — L820 Inflamed seborrheic keratosis: Secondary | ICD-10-CM | POA: Diagnosis not present

## 2021-11-02 DIAGNOSIS — L0100 Impetigo, unspecified: Secondary | ICD-10-CM | POA: Diagnosis not present

## 2021-11-05 ENCOUNTER — Other Ambulatory Visit: Payer: Self-pay

## 2021-11-05 ENCOUNTER — Ambulatory Visit (HOSPITAL_BASED_OUTPATIENT_CLINIC_OR_DEPARTMENT_OTHER): Payer: Medicare Other | Admitting: Anesthesiology

## 2021-11-05 ENCOUNTER — Encounter (HOSPITAL_BASED_OUTPATIENT_CLINIC_OR_DEPARTMENT_OTHER): Payer: Self-pay | Admitting: Orthopedic Surgery

## 2021-11-05 ENCOUNTER — Encounter (HOSPITAL_BASED_OUTPATIENT_CLINIC_OR_DEPARTMENT_OTHER)
Admission: RE | Disposition: A | Discharge status: Home / Self Care | Payer: Self-pay | Source: Home / Self Care | Attending: Orthopedic Surgery

## 2021-11-05 ENCOUNTER — Ambulatory Visit (HOSPITAL_BASED_OUTPATIENT_CLINIC_OR_DEPARTMENT_OTHER)
Admission: RE | Admit: 2021-11-05 | Discharge: 2021-11-05 | Disposition: A | Discharge status: Home / Self Care | Payer: Medicare Other | Attending: Orthopedic Surgery | Admitting: Orthopedic Surgery

## 2021-11-05 DIAGNOSIS — M1990 Unspecified osteoarthritis, unspecified site: Secondary | ICD-10-CM | POA: Diagnosis not present

## 2021-11-05 DIAGNOSIS — G5602 Carpal tunnel syndrome, left upper limb: Secondary | ICD-10-CM | POA: Insufficient documentation

## 2021-11-05 DIAGNOSIS — Z01818 Encounter for other preprocedural examination: Secondary | ICD-10-CM

## 2021-11-05 DIAGNOSIS — Z87891 Personal history of nicotine dependence: Secondary | ICD-10-CM | POA: Diagnosis not present

## 2021-11-05 DIAGNOSIS — K219 Gastro-esophageal reflux disease without esophagitis: Secondary | ICD-10-CM | POA: Insufficient documentation

## 2021-11-05 DIAGNOSIS — Z8546 Personal history of malignant neoplasm of prostate: Secondary | ICD-10-CM | POA: Diagnosis not present

## 2021-11-05 HISTORY — PX: CARPAL TUNNEL RELEASE: SHX101

## 2021-11-05 SURGERY — CARPAL TUNNEL RELEASE
Anesthesia: Monitor Anesthesia Care | Site: Hand | Laterality: Left

## 2021-11-05 MED ORDER — ACETAMINOPHEN 500 MG PO TABS
ORAL_TABLET | ORAL | Status: AC
  Filled 2021-11-05: qty 2

## 2021-11-05 MED ORDER — PROPOFOL 500 MG/50ML IV EMUL
INTRAVENOUS | Status: DC | PRN
  Administered 2021-11-05: 75 ug/kg/min via INTRAVENOUS

## 2021-11-05 MED ORDER — BUPIVACAINE HCL (PF) 0.25 % IJ SOLN
INTRAMUSCULAR | Status: DC | PRN
  Administered 2021-11-05: 9 mL

## 2021-11-05 MED ORDER — BUPIVACAINE HCL (PF) 0.25 % IJ SOLN
INTRAMUSCULAR | Status: AC
  Filled 2021-11-05: qty 30

## 2021-11-05 MED ORDER — ACETAMINOPHEN 500 MG PO TABS
1000.0000 mg | ORAL_TABLET | Freq: Once | ORAL | Status: AC
  Administered 2021-11-05: 1000 mg via ORAL

## 2021-11-05 MED ORDER — LIDOCAINE HCL (PF) 0.5 % IJ SOLN
INTRAMUSCULAR | Status: DC | PRN
  Administered 2021-11-05: 30 mL via INTRAVENOUS

## 2021-11-05 MED ORDER — ONDANSETRON HCL 4 MG/2ML IJ SOLN
INTRAMUSCULAR | Status: AC
  Filled 2021-11-05: qty 2

## 2021-11-05 MED ORDER — CEFAZOLIN SODIUM-DEXTROSE 2-4 GM/100ML-% IV SOLN
INTRAVENOUS | Status: AC
  Filled 2021-11-05: qty 100

## 2021-11-05 MED ORDER — FENTANYL CITRATE (PF) 100 MCG/2ML IJ SOLN
INTRAMUSCULAR | Status: DC | PRN
  Administered 2021-11-05 (×2): 50 ug via INTRAVENOUS

## 2021-11-05 MED ORDER — LACTATED RINGERS IV SOLN: INTRAVENOUS | Status: DC

## 2021-11-05 MED ORDER — ONDANSETRON HCL 4 MG/2ML IJ SOLN: 4.0000 mg | Freq: Once | INTRAMUSCULAR | Status: DC | PRN

## 2021-11-05 MED ORDER — MIDAZOLAM HCL 2 MG/2ML IJ SOLN
INTRAMUSCULAR | Status: AC
  Filled 2021-11-05: qty 2

## 2021-11-05 MED ORDER — MIDAZOLAM HCL 5 MG/5ML IJ SOLN
INTRAMUSCULAR | Status: DC | PRN
  Administered 2021-11-05: 2 mg via INTRAVENOUS

## 2021-11-05 MED ORDER — CEFAZOLIN SODIUM-DEXTROSE 2-4 GM/100ML-% IV SOLN
2.0000 g | INTRAVENOUS | Status: AC
  Administered 2021-11-05: 2 g via INTRAVENOUS

## 2021-11-05 MED ORDER — PROPOFOL 10 MG/ML IV BOLUS
INTRAVENOUS | Status: DC | PRN
  Administered 2021-11-05: 20 mg via INTRAVENOUS

## 2021-11-05 MED ORDER — FENTANYL CITRATE (PF) 100 MCG/2ML IJ SOLN: 25.0000 ug | INTRAMUSCULAR | Status: DC | PRN

## 2021-11-05 MED ORDER — FENTANYL CITRATE (PF) 100 MCG/2ML IJ SOLN
INTRAMUSCULAR | Status: AC
  Filled 2021-11-05: qty 2

## 2021-11-05 MED ORDER — TRAMADOL HCL 50 MG PO TABS: ORAL_TABLET | ORAL | 0 refills | Status: DC

## 2021-11-05 SURGICAL SUPPLY — 35 items
APL PRP STRL LF DISP 70% ISPRP (MISCELLANEOUS) ×2
BLADE SURG 15 STRL LF DISP TIS (BLADE) ×4 IMPLANT
BLADE SURG 15 STRL SS (BLADE) ×2
BNDG CMPR 9X4 STRL LF SNTH (GAUZE/BANDAGES/DRESSINGS)
BNDG ELASTIC 3X5.8 VLCR STR LF (GAUZE/BANDAGES/DRESSINGS) ×2 IMPLANT
BNDG ESMARK 4X9 LF (GAUZE/BANDAGES/DRESSINGS) IMPLANT
BNDG GAUZE DERMACEA FLUFF 4 (GAUZE/BANDAGES/DRESSINGS) ×2 IMPLANT
BNDG GZE DERMACEA 4 6PLY (GAUZE/BANDAGES/DRESSINGS) ×1
CHLORAPREP W/TINT 26 (MISCELLANEOUS) ×2 IMPLANT
CORD BIPOLAR FORCEPS 12FT (ELECTRODE) ×2 IMPLANT
COVER BACK TABLE 60X90IN (DRAPES) ×2 IMPLANT
COVER MAYO STAND STRL (DRAPES) ×2 IMPLANT
CUFF TOURN SGL QUICK 18X4 (TOURNIQUET CUFF) ×2 IMPLANT
DRAPE EXTREMITY T 121X128X90 (DISPOSABLE) ×2 IMPLANT
DRAPE SURG 17X23 STRL (DRAPES) ×2 IMPLANT
GAUZE PAD ABD 8X10 STRL (GAUZE/BANDAGES/DRESSINGS) ×2 IMPLANT
GAUZE SPONGE 4X4 12PLY STRL (GAUZE/BANDAGES/DRESSINGS) ×2 IMPLANT
GAUZE XEROFORM 1X8 LF (GAUZE/BANDAGES/DRESSINGS) ×2 IMPLANT
GLOVE BIO SURGEON STRL SZ7.5 (GLOVE) ×2 IMPLANT
GLOVE BIOGEL PI IND STRL 7.5 (GLOVE) IMPLANT
GLOVE BIOGEL PI IND STRL 8 (GLOVE) ×2 IMPLANT
GOWN STRL REUS W/ TWL LRG LVL3 (GOWN DISPOSABLE) ×2 IMPLANT
GOWN STRL REUS W/TWL LRG LVL3 (GOWN DISPOSABLE) ×1
GOWN STRL REUS W/TWL XL LVL3 (GOWN DISPOSABLE) ×2 IMPLANT
NDL HYPO 25X1 1.5 SAFETY (NEEDLE) ×2 IMPLANT
NEEDLE HYPO 25X1 1.5 SAFETY (NEEDLE) ×1 IMPLANT
NS IRRIG 1000ML POUR BTL (IV SOLUTION) ×2 IMPLANT
PACK BASIN DAY SURGERY FS (CUSTOM PROCEDURE TRAY) ×2 IMPLANT
PADDING CAST ABS COTTON 4X4 ST (CAST SUPPLIES) ×2 IMPLANT
STOCKINETTE 4X48 STRL (DRAPES) ×2 IMPLANT
SUT ETHILON 4 0 PS 2 18 (SUTURE) ×2 IMPLANT
SYR BULB EAR ULCER 3OZ GRN STR (SYRINGE) ×2 IMPLANT
SYR CONTROL 10ML LL (SYRINGE) ×2 IMPLANT
TOWEL GREEN STERILE FF (TOWEL DISPOSABLE) ×4 IMPLANT
UNDERPAD 30X36 HEAVY ABSORB (UNDERPADS AND DIAPERS) ×2 IMPLANT

## 2021-11-05 NOTE — Discharge Instructions (Addendum)
No Tylenol until 5:40 today if needed  Post Anesthesia Home Care Instructions  Activity: Get plenty of rest for the remainder of the day. A responsible individual must stay with you for 24 hours following the procedure.  For the next 24 hours, DO NOT: -Drive a car -Paediatric nurse -Drink alcoholic beverages -Take any medication unless instructed by your physician -Make any legal decisions or sign important papers.  Meals: Start with liquid foods such as gelatin or soup. Progress to regular foods as tolerated. Avoid greasy, spicy, heavy foods. If nausea and/or vomiting occur, drink only clear liquids until the nausea and/or vomiting subsides. Call your physician if vomiting continues.  Special Instructions/Symptoms: Your throat may feel dry or sore from the anesthesia or the breathing tube placed in your throat during surgery. If this causes discomfort, gargle with warm salt water. The discomfort should disappear within 24 hours.  If you had a scopolamine patch placed behind your ear for the management of post- operative nausea and/or vomiting:  1. The medication in the patch is effective for 72 hours, after which it should be removed.  Wrap patch in a tissue and discard in the trash. Wash hands thoroughly with soap and water. 2. You may remove the patch earlier than 72 hours if you experience unpleasant side effects which may include dry mouth, dizziness or visual disturbances. 3. Avoid touching the patch. Wash your hands with soap and water after contact with the patch.  Regional Anesthesia Blocks  1. Numbness or the inability to move the "blocked" extremity may last from 3-48 hours after placement. The length of time depends on the medication injected and your individual response to the medication. If the numbness is not going away after 48 hours, call your surgeon.  2. The extremity that is blocked will need to be protected until the numbness is gone and the  Strength has returned.  Because you cannot feel it, you will need to take extra care to avoid injury. Because it may be weak, you may have difficulty moving it or using it. You may not know what position it is in without looking at it while the block is in effect.  3. For blocks in the legs and feet, returning to weight bearing and walking needs to be done carefully. You will need to wait until the numbness is entirely gone and the strength has returned. You should be able to move your leg and foot normally before you try and bear weight or walk. You will need someone to be with you when you first try to ensure you do not fall and possibly risk injury.  4. Bruising and tenderness at the needle site are common side effects and will resolve in a few days.  5. Persistent numbness or new problems with movement should be communicated to the surgeon or the [facility] [phone] [place] [phone]).       [facility] Instructions Hand Surgery  Wound Care: Keep your hand elevated above the level of your heart.  Do not allow it to dangle by your side.  Keep the dressing dry and do not remove it unless your doctor advises you to do so.  He will usually change it at the time of your post-op visit.  Moving your fingers is advised to stimulate circulation but will depend on the site of your surgery.  If you have a splint applied, your doctor will advise you regarding movement.  Activity: Do not drive or  operate machinery today.  Rest today and then you may return to your normal activity and work as indicated by your physician.  Diet:  Drink liquids today or eat a light diet.  You may resume a regular diet tomorrow.    General expectations: Pain for two to three days. Fingers may become slightly swollen.  Call your doctor if any of the following occur: Severe pain not relieved by pain medication. Elevated temperature. Dressing soaked with blood. Inability to move fingers. White or  bluish color to fingers.

## 2021-11-05 NOTE — H&P (Signed)
  David Mata is an 76 y.o. male.   Chief Complaint: carpal tunnel syndrome HPI: 76 yo male with numbness and tingling left hand.  Positive nerve conduction studies.  Nocturnal symptoms.  He wishes to have left carpal tunnel release.  Allergies: No Known Allergies  Past Medical History:  Diagnosis Date   Acid reflux    Arthritis    Erectile dysfunction    Gout    Gout    Hepatitis    Hypoglycemia    Prostate cancer ([place])    Prostate cancer [facility])     Past Surgical History:  Procedure Laterality Date   ACHILLES TENDON REPAIR  2011   right heel    CHOLECYSTECTOMY  12/07/10   with [place]  2008, 2010   Gout  both elbows   FOOT SURGERY     HAND SURGERY  2010   gout    INSERTION PROSTATE RADIATION SEED  2004   PROSTATE SURGERY      Family History: Family History  Problem Relation Age of Onset   Heart failure Mother    Heart failure Father    Heart attack Father    Cancer Brother    Cancer Paternal Grandfather    Heart attack Paternal Grandfather    Diabetes Maternal Grandfather     Social History:   reports that he has quit smoking. His smoking use included cigars. He has never used smokeless tobacco. He reports current alcohol use. He reports that he does not use drugs.  Medications: Medications Prior to Admission  Medication Sig Dispense Refill   naproxen (NAPROSYN) 500 MG tablet TAKE 1 TABLET BY MOUTH 2 TIMES DAILY WITH A MEAL. 60 tablet 5    No results found for this or any previous visit (from the past 48 hour(s)).  No results found.    Blood pressure (!) 155/69, pulse 60, temperature (!) 97.5 F (36.4 C), temperature source Oral, resp. rate 17, height '6\' 1"'$  (1.854 m), weight 112.9 kg, SpO2 100 %.  General appearance: alert, cooperative, and appears stated age Head: Normocephalic, without obvious abnormality, atraumatic Neck: supple, symmetrical, trachea midline Extremities: Intact sensation and capillary refill all digits.  +epl/fpl/io.   No wounds.  Pulses: 2+ and symmetric Skin: Skin color, texture, turgor normal. No rashes or lesions Neurologic: Grossly normal Incision/Wound: none  Assessment/Plan Left carpal tunnel syndrome.  Non operative and operative treatment options have been discussed with the patient and patient wishes to proceed with operative treatment. Risks, benefits, and alternatives of surgery have been discussed and the patient agrees with the plan of care.   Leanora Cover 11/05/2021, 12:43 PM

## 2021-11-05 NOTE — Anesthesia Postprocedure Evaluation (Signed)
Anesthesia Post Note  Patient: David Mata  Procedure(s) Performed: LEFT CARPAL TUNNEL RELEASE (Left: Hand)     Patient location during evaluation: PACU Anesthesia Type: MAC Level of consciousness: awake and alert Pain management: pain level controlled Vital Signs Assessment: post-procedure vital signs reviewed and stable Respiratory status: spontaneous breathing, nonlabored ventilation and respiratory function stable Cardiovascular status: stable and blood pressure returned to baseline Anesthetic complications: no   No notable events documented.  Last Vitals:  Vitals:   11/05/21 1445 11/05/21 1459  BP: (!) 142/80   Pulse: (!) 51 (!) 56  Resp: 11 11  Temp:    SpO2: 97% 95%    Last Pain:  Vitals:   11/05/21 1445  TempSrc:   PainSc: 0-No pain                 Audry Pili

## 2021-11-05 NOTE — Anesthesia Preprocedure Evaluation (Addendum)
Anesthesia Evaluation  Patient identified by MRN, date of birth, ID band Patient awake    Reviewed: Allergy & Precautions, NPO status , Patient's Chart, lab work & pertinent test results  Airway Mallampati: III  TM Distance: >3 FB Neck ROM: Full    Dental no notable dental hx. (+) Missing,    Pulmonary former smoker,    Pulmonary exam normal breath sounds clear to auscultation       Cardiovascular negative cardio ROS Normal cardiovascular exam Rhythm:Regular Rate:Normal     Neuro/Psych negative neurological ROS  negative psych ROS   GI/Hepatic Neg liver ROS, GERD  Controlled,  Endo/Other  BMI 32  Renal/GU negative Renal ROS  negative genitourinary   Musculoskeletal  (+) Arthritis , Osteoarthritis,    Abdominal   Peds  Hematology negative hematology ROS (+)   Anesthesia Other Findings   Reproductive/Obstetrics negative OB ROS                            Anesthesia Physical Anesthesia Plan  ASA: 2  Anesthesia Plan: MAC and Bier Block and Bier Block-LIDOCAINE ONLY   Post-op Pain Management: Tylenol PO (pre-op)*   Induction:   PONV Risk Score and Plan: 2 and Propofol infusion and TIVA  Airway Management Planned: Natural Airway and Simple Face Mask  Additional Equipment: None  Intra-op Plan:   Post-operative Plan:   Informed Consent: I have reviewed the patients History and Physical, chart, labs and discussed the procedure including the risks, benefits and alternatives for the proposed anesthesia with the patient or authorized representative who has indicated his/her understanding and acceptance.       Plan Discussed with: CRNA  Anesthesia Plan Comments:        Anesthesia Quick Evaluation

## 2021-11-05 NOTE — Op Note (Signed)
11/05/2021 [facility]                              OPERATIVE REPORT   PREOPERATIVE DIAGNOSIS:  Left carpal tunnel syndrome.  POSTOPERATIVE DIAGNOSIS:  Left carpal tunnel syndrome.  PROCEDURE:  Left carpal tunnel release.  SURGEON:  Leanora Cover, MD  ASSISTANT:  none.  ANESTHESIA: Bier block with sedation  IV FLUIDS:  Per anesthesia flow sheet.  ESTIMATED BLOOD LOSS:  Minimal.  COMPLICATIONS:  None.  SPECIMENS:  None.  TOURNIQUET TIME:    Total Tourniquet Time Documented: Forearm (Left) - 27 minutes Total: Forearm (Left) - 27 minutes   DISPOSITION:  Stable to PACU.  LOCATION: [facility]  INDICATIONS:  76 yo male with numbness and tingling left hand.  Nocturnal symptoms.  Positive nerve conduction studies.   He wishes to have a carpal tunnel release for management of his symptoms.  Risks, benefits and alternatives of surgery were discussed including the risk of blood loss; infection; damage to nerves, vessels, tendons, ligaments, bone; failure of surgery; need for additional surgery; complications with wound healing; continued pain; recurrence of carpal tunnel syndrome; and damage to motor branch. He voiced understanding of these risks and elected to proceed.   OPERATIVE COURSE:  After being identified preoperatively by myself, the patient and I agreed upon the procedure and site of procedure.  The surgical site was marked.  Surgical consent had been signed.  He was given IV Ancef as preoperative antibiotic prophylaxis.  He was transferred to the operating room and placed on the operating room table in supine position with the Left upper extremity on an armboard.  Bier block anesthesia was induced by the anesthesiologist.  Left upper extremity was prepped and draped in normal sterile orthopaedic fashion.  A surgical pause was performed between the surgeons, anesthesia, and operating room staff, and all were in agreement as to the patient, procedure,  and site of procedure.  Tourniquet at the proximal aspect of the forearm had been inflated for the Bier block  Incision was made over the transverse carpal ligament and carried into the subcutaneous tissues by spreading technique.  Bipolar electrocautery was used to obtain hemostasis.  The palmar fascia was sharply incised.  The transverse carpal ligament was identified.  The fascia distal to the ligament was opened.  Retractor was placed and the flexor tendons were identified.  The flexor tendon to the ring finger was identified and retracted radially.  The transverse carpal ligament was then incised from distal to proximal under direct visualization.  Scissors were used to split the distal aspect of the volar antebrachial fascia.  A finger was placed into the wound to ensure complete decompression, which was the case.  The nerve was examined.  There was an hourglass deformity.  The motor branch was identified and was intact.  The wound was copiously irrigated with sterile saline.  It was then closed with 4-0 nylon in a horizontal mattress fashion.  It was injected with 0.25% plain Marcaine to aid in postoperative analgesia.  It was dressed with sterile Xeroform, 4x4s, an ABD, and wrapped with Kerlix and an Ace bandage.  Tourniquet was deflated at 27 minutes.  Fingertips were pink with brisk capillary refill after deflation of the tourniquet.  Operative drapes were broken down.  The patient was awoken from anesthesia safely.  He was transferred back to stretcher and taken to the PACU in stable condition.  I will see him back in the office in 1 week for postoperative followup.  I will give him a prescription for Tramadol 50 mg 1-2 tabs PO q6 hours prn pain, dispense # 20.    Leanora Cover, MD Electronically signed, 11/05/21

## 2021-11-05 NOTE — Transfer of Care (Signed)
Immediate Anesthesia Transfer of Care Note  Patient: David Mata  Procedure(s) Performed: LEFT CARPAL TUNNEL RELEASE (Left: Hand)  Patient Location: PACU  Anesthesia Type:MAC and Bier block  Level of Consciousness: drowsy, patient cooperative and responds to stimulation  Airway & Oxygen Therapy: Patient Spontanous Breathing and Patient connected to face mask oxygen  Post-op Assessment: Report given to RN and Post -op Vital signs reviewed and stable  Post vital signs: Reviewed and stable  Last Vitals:  Vitals Value Taken Time  BP    Temp    Pulse    Resp    SpO2      Last Pain:  Vitals:   11/05/21 1138  TempSrc: Oral  PainSc: 0-No pain         Complications: No notable events documented.

## 2021-11-05 NOTE — Anesthesia Procedure Notes (Signed)
Date/Time: 11/05/2021 1:35 PM  Performed by: Glory Buff, CRNAOxygen Delivery Method: Simple face mask

## 2021-11-05 NOTE — Anesthesia Procedure Notes (Signed)
Anesthesia Regional Block: Bier block (IV Regional)   Pre-Anesthetic Checklist: , timeout performed,  Correct Patient, Correct Site, Correct Laterality,  Correct Procedure, Correct Position, site marked,  Risks and benefits discussed,  Surgical consent,  Pre-op evaluation,  At surgeon's request  Laterality: Right  Prep: alcohol swabs        Narrative:  Start time: 11/05/2021 1:42 PM End time: 11/05/2021 1:43 PM Injection made incrementally with aspirations every 30 mL.  Performed by: With CRNAs

## 2021-11-06 ENCOUNTER — Encounter (HOSPITAL_BASED_OUTPATIENT_CLINIC_OR_DEPARTMENT_OTHER): Payer: Self-pay | Admitting: Orthopedic Surgery

## 2022-01-07 DIAGNOSIS — Z79899 Other long term (current) drug therapy: Secondary | ICD-10-CM | POA: Diagnosis not present

## 2022-01-07 DIAGNOSIS — Z0000 Encounter for general adult medical examination without abnormal findings: Secondary | ICD-10-CM | POA: Diagnosis not present

## 2022-01-07 DIAGNOSIS — N529 Male erectile dysfunction, unspecified: Secondary | ICD-10-CM | POA: Diagnosis not present

## 2022-01-07 DIAGNOSIS — Z8546 Personal history of malignant neoplasm of prostate: Secondary | ICD-10-CM | POA: Diagnosis not present

## 2022-01-07 DIAGNOSIS — M109 Gout, unspecified: Secondary | ICD-10-CM | POA: Diagnosis not present

## 2022-01-07 DIAGNOSIS — Z13220 Encounter for screening for lipoid disorders: Secondary | ICD-10-CM | POA: Diagnosis not present

## 2022-06-03 DIAGNOSIS — H5203 Hypermetropia, bilateral: Secondary | ICD-10-CM | POA: Diagnosis not present

## 2022-10-17 DIAGNOSIS — M79672 Pain in left foot: Secondary | ICD-10-CM | POA: Diagnosis not present

## 2022-10-17 DIAGNOSIS — M19079 Primary osteoarthritis, unspecified ankle and foot: Secondary | ICD-10-CM | POA: Diagnosis not present

## 2022-10-17 DIAGNOSIS — M79671 Pain in right foot: Secondary | ICD-10-CM | POA: Diagnosis not present

## 2022-11-21 ENCOUNTER — Other Ambulatory Visit (HOSPITAL_COMMUNITY): Payer: Self-pay | Admitting: Podiatry

## 2022-11-21 DIAGNOSIS — M79671 Pain in right foot: Secondary | ICD-10-CM | POA: Diagnosis not present

## 2022-11-21 DIAGNOSIS — R0989 Other specified symptoms and signs involving the circulatory and respiratory systems: Secondary | ICD-10-CM

## 2022-11-21 DIAGNOSIS — M19079 Primary osteoarthritis, unspecified ankle and foot: Secondary | ICD-10-CM | POA: Diagnosis not present

## 2022-11-21 DIAGNOSIS — L853 Xerosis cutis: Secondary | ICD-10-CM | POA: Diagnosis not present

## 2022-11-27 ENCOUNTER — Ambulatory Visit (HOSPITAL_COMMUNITY)
Admission: RE | Admit: 2022-11-27 | Discharge: 2022-11-27 | Disposition: A | Discharge status: Home / Self Care | Payer: Medicare Other | Source: Ambulatory Visit | Attending: Podiatry | Admitting: Podiatry

## 2022-11-27 DIAGNOSIS — M79672 Pain in left foot: Secondary | ICD-10-CM | POA: Insufficient documentation

## 2022-11-27 DIAGNOSIS — R0989 Other specified symptoms and signs involving the circulatory and respiratory systems: Secondary | ICD-10-CM | POA: Diagnosis not present

## 2022-11-27 DIAGNOSIS — M79671 Pain in right foot: Secondary | ICD-10-CM | POA: Insufficient documentation

## 2023-01-09 DIAGNOSIS — Z0000 Encounter for general adult medical examination without abnormal findings: Secondary | ICD-10-CM | POA: Diagnosis not present

## 2023-01-09 DIAGNOSIS — M109 Gout, unspecified: Secondary | ICD-10-CM | POA: Diagnosis not present

## 2023-01-09 DIAGNOSIS — Z23 Encounter for immunization: Secondary | ICD-10-CM | POA: Diagnosis not present

## 2023-01-09 DIAGNOSIS — Z125 Encounter for screening for malignant neoplasm of prostate: Secondary | ICD-10-CM | POA: Diagnosis not present

## 2023-01-09 DIAGNOSIS — Z131 Encounter for screening for diabetes mellitus: Secondary | ICD-10-CM | POA: Diagnosis not present

## 2023-01-09 DIAGNOSIS — Z136 Encounter for screening for cardiovascular disorders: Secondary | ICD-10-CM | POA: Diagnosis not present

## 2023-06-16 ENCOUNTER — Ambulatory Visit: Admitting: Cardiology

## 2023-06-17 ENCOUNTER — Ambulatory Visit: Attending: Cardiology | Admitting: Cardiology

## 2023-06-17 ENCOUNTER — Encounter: Payer: Self-pay | Admitting: Cardiology

## 2023-06-17 VITALS — BP 144/80 | HR 54 | Ht 73.0 in | Wt 248.0 lb

## 2023-06-17 DIAGNOSIS — R011 Cardiac murmur, unspecified: Secondary | ICD-10-CM

## 2023-06-17 DIAGNOSIS — R030 Elevated blood-pressure reading, without diagnosis of hypertension: Secondary | ICD-10-CM | POA: Diagnosis not present

## 2023-06-17 DIAGNOSIS — I444 Left anterior fascicular block: Secondary | ICD-10-CM

## 2023-06-17 NOTE — Patient Instructions (Signed)
 Medication Instructions:  Your physician recommends that you continue on your current medications as directed. Please refer to the Current Medication list given to you today.  *If you need a refill on your cardiac medications before your next appointment, please call your pharmacy*  Lab Work: None If you have labs (blood work) drawn today and your tests are completely normal, you will receive your results only by: MyChart Message (if you have MyChart) OR A paper copy in the mail If you have any lab test that is abnormal or we need to change your treatment, we will call you to review the results.  Testing/Procedures: Your physician has requested that you have an echocardiogram. Echocardiography is a painless test that uses sound waves to create images of your heart. It provides your doctor with information about the size and shape of your heart and how well your heart's chambers and valves are working. This procedure takes approximately one hour. There are no restrictions for this procedure. Please do NOT wear cologne, perfume, aftershave, or lotions (deodorant is allowed). Please arrive 15 minutes prior to your appointment time.  Please note: We ask at that you not bring children with you during ultrasound (echo/ vascular) testing. Due to room size and safety concerns, children are not allowed in the ultrasound rooms during exams. Our front office staff cannot provide observation of children in our lobby area while testing is being conducted. An adult accompanying a patient to their appointment will only be allowed in the ultrasound room at the discretion of the ultrasound technician under special circumstances. We apologize for any inconvenience.   Follow-Up: At [facility], you and your health needs are our priority.  As part of our continuing mission to provide you with exceptional heart care, our providers are all part of one team.  This team includes your primary Cardiologist  (physician) and Advanced Practice Providers or APPs (Physician Assistants and Nurse Practitioners) who all work together to provide you with the care you need, when you need it.  Your next appointment:    To Be Determined.   Provider:   You may see Armida Lander, MD or one of the following Advanced Practice Providers on your designated Care Team:   Woodfin Hays, PA-C  [place], [place] Theotis Flake, [place]     We recommend signing up for the patient portal called "MyChart".  Sign up information is provided on this After Visit Summary.  MyChart is used to connect with patients for Virtual Visits (Telemedicine).  Patients are able to view lab/test results, encounter notes, upcoming appointments, etc.  Non-urgent messages can be sent to your provider as well.   To learn more about what you can do with MyChart, go to [email or web address].   Other Instructions

## 2023-06-17 NOTE — Progress Notes (Signed)
      Clinical Summary David Mata is a 78 y.o.male seen today as a new patient. Last seen by Dr Avanell Bob in 2015    1. Elevated blood pressure - noted in clinic today - reports home bp's can vary, SBP 130s to 160s but does not regularly sit 5 minutes before checking - denies any prior history of HTN, has not had to be on medical therapy.     2. Heart murmur - denies any chest pains, no SOB/DOE.      SH: works in Engineer, materials   Past Medical History:  Diagnosis Date   Acid reflux    Arthritis    Erectile dysfunction    Gout    Gout    Hepatitis    Hypoglycemia    Prostate cancer (HCC)    Prostate cancer (HCC)      No Known Allergies   Current Outpatient Medications  Medication Sig Dispense Refill   naproxen  sodium (ALEVE ) 220 MG tablet Take 220 mg by mouth daily as needed.     No current facility-administered medications for this visit.     Past Surgical History:  Procedure Laterality Date   ACHILLES TENDON REPAIR  2011   right heel    CARPAL TUNNEL RELEASE Left 11/05/2021   Procedure: LEFT CARPAL TUNNEL RELEASE;  Surgeon: Brunilda Capra, MD;  Location: [facility];  Service: Orthopedics;  Laterality: Left;   CHOLECYSTECTOMY  12/07/10   with IOC   ELBOW SURGERY  2008, 2010   Gout  both elbows   FOOT SURGERY     HAND SURGERY  2010   gout    INSERTION PROSTATE RADIATION SEED  2004   PROSTATE SURGERY       No Known Allergies    Family History  Problem Relation Age of Onset   Heart failure Mother    Heart failure Father    Heart attack Father    Cancer Brother    Cancer Paternal Grandfather    Heart attack Paternal Grandfather    Diabetes Maternal Grandfather      Social History David Mata reports that he has quit smoking. His smoking use included cigars. He has never used smokeless tobacco. David Mata reports current alcohol use.    Physical Examination Today's Vitals   06/17/23 1045 06/17/23 1119  BP: (!)  164/78 (!) 144/80  Pulse: (!) 54   SpO2: 96%   Weight: 248 lb (112.5 kg)   Height: 6\' 1"  (1.854 m)    Body mass index is 32.72 kg/m.  Gen: resting comfortably, no acute distress HEENT: no scleral icterus, pupils equal round and reactive, no palptable cervical adenopathy,  CV: RRR, 3/6 systolic murmur rusb, no jvd Resp: Clear to auscultation bilaterally GI: abdomen is soft, non-tender, non-distended, normal bowel sounds, no hepatosplenomegaly MSK: extremities are warm, no edema.  Skin: warm, no rash Neuro:  no focal deficits Psych: appropriate affect    Assessment and Plan  1.Heart murmur  Noted on exam, we will plan for echo to further evaluate  2. Elevated blood pressure - he will monitor home bp x 1 week and drop off bp log when he comes for echo - would likely consider norvasc if needed   EKG today shows sinus brady 55, LAFB  Request pcp labs   F/u pending echo results    Laurann Pollock, M.D.

## 2023-07-21 ENCOUNTER — Ambulatory Visit (HOSPITAL_COMMUNITY)
Admission: RE | Admit: 2023-07-21 | Discharge: 2023-07-21 | Disposition: A | Discharge status: Home / Self Care | Source: Ambulatory Visit | Attending: Cardiology | Admitting: Cardiology

## 2023-07-21 DIAGNOSIS — R011 Cardiac murmur, unspecified: Secondary | ICD-10-CM | POA: Diagnosis not present

## 2023-07-21 LAB — ECHOCARDIOGRAM COMPLETE
AR max vel: 1.45 cm2
AV Area VTI: 1.6 cm2
AV Area mean vel: 1.59 cm2
AV Mean grad: 12 mmHg
AV Peak grad: 23.4 mmHg
Ao pk vel: 2.42 m/s
Area-P 1/2: 2.34 cm2
S' Lateral: 2.5 cm

## 2023-07-21 NOTE — Progress Notes (Signed)
*  PRELIMINARY RESULTS* Echocardiogram 2D Echocardiogram has been performed.  David Mata 07/21/2023, 11:31 AM

## 2023-08-13 ENCOUNTER — Ambulatory Visit: Admitting: Orthopaedic Surgery

## 2023-08-13 ENCOUNTER — Other Ambulatory Visit (INDEPENDENT_AMBULATORY_CARE_PROVIDER_SITE_OTHER): Payer: Self-pay

## 2023-08-13 ENCOUNTER — Encounter: Payer: Self-pay | Admitting: Orthopaedic Surgery

## 2023-08-13 VITALS — BP 169/79 | HR 56 | Ht 71.0 in | Wt 249.0 lb

## 2023-08-13 DIAGNOSIS — M545 Low back pain, unspecified: Secondary | ICD-10-CM

## 2023-08-13 DIAGNOSIS — M5432 Sciatica, left side: Secondary | ICD-10-CM | POA: Diagnosis not present

## 2023-08-13 DIAGNOSIS — M25552 Pain in left hip: Secondary | ICD-10-CM

## 2023-08-13 NOTE — Progress Notes (Signed)
 My hip and back hurt.  He has fallen four times over the last six weeks or so.  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  He has been out there and fallen almost weekly until a month ago four times.  He has need to wear boots and has not had them on and he slips and falls.  He knows he should have proper foot wear.  When he falls, he lands on his buttocks and then falls backwards on his back.  He has had bruising of his buttocks.  He started having left hip pain and pain in the lower back that would not go away.  He has gotten workmen to help in the barn now and he has limited his activity but he still hurts some.  He does not like to take any medicine.  He has taken two Aleve  when needed and they help.  He has good ROM of the lumbar spine, NV intact, ROM of the left hip is good, slight tenderness is present of the left trochanteric area, gait is good but has a slight limp to the left.  Muscle tone and strength are normal.  X-rays were done of the left hip and the lumbar spine.  He has a compression fracture of L2 age indeterminate.  He has also spondylolisthesis of L4 on L5 very mild Grade I.  Encounter Diagnoses  Name Primary?   Lumbar spine pain Yes   Sciatica, left side    Pain in left hip    I feel he has a small compression fracture of L2 and is recovering now from it.  He is about six weeks from injury.  I could get a MRI but treatment would be the same:  take it easy, no lifting, avoid long rides in car, etc.  He is agreeable to waiting and see how he does.  I will see him as needed.  He may need MRI.  Call if any problem.  Precautions discussed.  Electronically Signed Lemond Stable, MD 7/16/202511:03 AM

## 2023-08-19 ENCOUNTER — Ambulatory Visit: Payer: Self-pay | Admitting: Cardiology

## 2023-10-21 ENCOUNTER — Ambulatory Visit: Admitting: Cardiology

## 2023-12-24 ENCOUNTER — Encounter: Payer: Self-pay | Admitting: Urology

## 2023-12-24 ENCOUNTER — Ambulatory Visit: Admitting: Urology

## 2023-12-24 VITALS — BP 180/73 | HR 63

## 2023-12-24 DIAGNOSIS — N5201 Erectile dysfunction due to arterial insufficiency: Secondary | ICD-10-CM

## 2023-12-24 DIAGNOSIS — N529 Male erectile dysfunction, unspecified: Secondary | ICD-10-CM | POA: Diagnosis not present

## 2023-12-24 DIAGNOSIS — C61 Malignant neoplasm of prostate: Secondary | ICD-10-CM

## 2023-12-24 DIAGNOSIS — R350 Frequency of micturition: Secondary | ICD-10-CM

## 2023-12-24 LAB — URINALYSIS, ROUTINE W REFLEX MICROSCOPIC
Bilirubin, UA: NEGATIVE
Glucose, UA: NEGATIVE
Ketones, UA: NEGATIVE
Leukocytes,UA: NEGATIVE
Nitrite, UA: NEGATIVE
Protein,UA: NEGATIVE
RBC, UA: NEGATIVE
Specific Gravity, UA: 1.01 (ref 1.005–1.030)
Urobilinogen, Ur: 0.2 mg/dL (ref 0.2–1.0)
pH, UA: 6 (ref 5.0–7.5)

## 2023-12-24 MED ORDER — GEMTESA 75 MG PO TABS: 1.0000 | ORAL_TABLET | Freq: Every day | ORAL | Status: AC

## 2023-12-24 MED ORDER — TADALAFIL 20 MG PO TABS: 20.0000 mg | ORAL_TABLET | ORAL | 5 refills | Status: AC | PRN

## 2023-12-24 NOTE — Patient Instructions (Signed)

## 2023-12-24 NOTE — Progress Notes (Signed)
   Patient can void prior to the bladder scan. Bladder scan result: 0  Performed By: [facility] LPN

## 2023-12-24 NOTE — Progress Notes (Signed)
 12/24/2023 1:24 PM   David Mata Feb 21, 1945 [id number]  Referring provider: No referring provider defined for this encounter.  Followup prostate cancer  HPI: [name] is a 78yo here for followup for prostate cancer and BPOh with urinary frequency. Last PSA was 0.05 in 12/2022. Over the past 3 months he has noted increased urinary frequency, urgency, urge incontinence and nocturia. IPSS 18 QOL 5. Nocturia 2x. PVR 0cc. He has intermittent straining to urinate.     PMH: Past Medical History:  Diagnosis Date   Acid reflux    Arthritis    Erectile dysfunction    Gout    Gout    Hepatitis    Hypoglycemia    Prostate cancer (HCC)    Prostate cancer [facility])     Surgical History: Past Surgical History:  Procedure Laterality Date   ACHILLES TENDON REPAIR  2011   right heel    CARPAL TUNNEL RELEASE Left 11/05/2021   Procedure: LEFT CARPAL TUNNEL RELEASE;  Surgeon: Murrell Drivers, MD;  Location: [facility];  Service: Orthopedics;  Laterality: Left;   CHOLECYSTECTOMY  12/07/10   with IOC   ELBOW SURGERY  2008, 2010   Gout  both elbows   FOOT SURGERY     HAND SURGERY  2010   gout    INSERTION PROSTATE RADIATION SEED  2004   PROSTATE SURGERY      Home Medications:  Allergies as of 12/24/2023   No Known Allergies      Medication List        Accurate as of December 24, 2023  1:24 PM. If you have any questions, ask your nurse or doctor.          naproxen  sodium 220 MG tablet Commonly known as: ALEVE  Take 220 mg by mouth daily as needed.        Allergies: No Known Allergies  Family History: Family History  Problem Relation Age of Onset   Heart failure Mother    Heart failure Father    Heart attack Father    Cancer Brother    Cancer Paternal Grandfather    Heart attack Paternal Grandfather    Diabetes Maternal Grandfather     Social History:  reports that he has quit smoking. His smoking use included cigars. He has never used smokeless  tobacco. He reports current alcohol use. He reports that he does not use drugs.  ROS: All other review of systems were reviewed and are negative except what is noted above in HPI  Physical Exam: BP (!) 180/73   Pulse 63   Constitutional:  Alert and oriented, No acute distress. HEENT: [place] AT, moist mucus membranes.  Trachea midline, no masses. Cardiovascular: No clubbing, cyanosis, or edema. Respiratory: Normal respiratory effort, no increased work of breathing. GI: Abdomen is soft, nontender, nondistended, no abdominal masses GU: No CVA tenderness.  Lymph: No cervical or inguinal lymphadenopathy. Skin: No rashes, bruises or suspicious lesions. Neurologic: Grossly intact, no focal deficits, moving all 4 extremities. Psychiatric: Normal mood and affect.  Laboratory Data: Lab Results  Component Value Date   HGB 17.6 (H) 01/04/2009    No results found for: CREATININE  No results found for: PSA  No results found for: TESTOSTERONE  No results found for: HGBA1C  Urinalysis    Component Value Date/Time   APPEARANCEUR Clear 04/09/2021 0937   GLUCOSEU Negative 04/09/2021 0937   BILIRUBINUR Negative 04/09/2021 0937   PROTEINUR Negative 04/09/2021 0937   NITRITE Negative 04/09/2021 0937  LEUKOCYTESUR Negative 04/09/2021 9062    Lab Results  Component Value Date   LABMICR Comment 04/09/2021    Pertinent Imaging:  No results found for this or any previous visit.  No results found for this or any previous visit.  No results found for this or any previous visit.  No results found for this or any previous visit.  No results found for this or any previous visit.  No results found for this or any previous visit.  No results found for this or any previous visit.  No results found for this or any previous visit.   Assessment & Plan:    1. Prostate cancer (HCC) (Primary) -followup 1 year with a PSA - Urinalysis, Routine w reflex microscopic  2. Urinary  frequency -we will trial gemtesa  75mg .  - BLADDER SCAN AMB NON-IMAGING  3. Erectile dysfunction -we will trial tadalafil  20mg  prn   No follow-ups on file.  Belvie Clara, MD  [facility] [place]

## 2024-01-09 DIAGNOSIS — M109 Gout, unspecified: Secondary | ICD-10-CM | POA: Diagnosis not present

## 2024-01-09 DIAGNOSIS — Z136 Encounter for screening for cardiovascular disorders: Secondary | ICD-10-CM | POA: Diagnosis not present

## 2024-01-09 DIAGNOSIS — Z0000 Encounter for general adult medical examination without abnormal findings: Secondary | ICD-10-CM | POA: Diagnosis not present

## 2024-01-09 DIAGNOSIS — Z1331 Encounter for screening for depression: Secondary | ICD-10-CM | POA: Diagnosis not present

## 2024-01-09 DIAGNOSIS — Z23 Encounter for immunization: Secondary | ICD-10-CM | POA: Diagnosis not present

## 2024-07-02 ENCOUNTER — Ambulatory Visit: Admitting: Urology
